# Patient Record
Sex: Female | Born: 1985 | Race: Black or African American | Hispanic: No | Marital: Married | Smoking: Never smoker
Health system: Southern US, Community
[De-identification: ages and names within clinical notes are randomized; demographics above are authoritative.]

## PROBLEM LIST (undated history)

## (undated) DIAGNOSIS — G43909 Migraine, unspecified, not intractable, without status migrainosus: Secondary | ICD-10-CM

## (undated) DIAGNOSIS — D649 Anemia, unspecified: Secondary | ICD-10-CM

## (undated) DIAGNOSIS — N390 Urinary tract infection, site not specified: Secondary | ICD-10-CM

## (undated) DIAGNOSIS — I10 Essential (primary) hypertension: Secondary | ICD-10-CM

## (undated) HISTORY — DX: Migraine, unspecified, not intractable, without status migrainosus: G43.909

## (undated) HISTORY — PX: SHOULDER SURGERY: SHX246

## (undated) HISTORY — PX: CHOLECYSTECTOMY: SHX55

---
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
## 2013-09-14 DIAGNOSIS — Z3009 Encounter for other general counseling and advice on contraception: Secondary | ICD-10-CM | POA: Insufficient documentation

## 2014-03-20 ENCOUNTER — Ambulatory Visit: Payer: Self-pay | Admitting: Internal Medicine

## 2014-08-24 ENCOUNTER — Ambulatory Visit
Admit: 2014-08-24 | Disposition: A | Discharge status: Home / Self Care | Payer: Self-pay | Attending: Family Medicine | Admitting: Family Medicine

## 2014-12-02 ENCOUNTER — Emergency Department
Admission: EM | Admit: 2014-12-02 | Discharge: 2014-12-02 | Disposition: A | Discharge status: Home / Self Care | Payer: 59 | Attending: Emergency Medicine | Admitting: Emergency Medicine

## 2014-12-02 ENCOUNTER — Encounter: Payer: Self-pay | Admitting: Emergency Medicine

## 2014-12-02 ENCOUNTER — Other Ambulatory Visit: Payer: Self-pay

## 2014-12-02 DIAGNOSIS — R5383 Other fatigue: Secondary | ICD-10-CM | POA: Insufficient documentation

## 2014-12-02 DIAGNOSIS — R531 Weakness: Secondary | ICD-10-CM | POA: Diagnosis not present

## 2014-12-02 DIAGNOSIS — F419 Anxiety disorder, unspecified: Secondary | ICD-10-CM | POA: Insufficient documentation

## 2014-12-02 LAB — BASIC METABOLIC PANEL
Anion gap: 6 (ref 5–15)
BUN: 7 mg/dL (ref 6–20)
CALCIUM: 8.6 mg/dL — AB (ref 8.9–10.3)
CHLORIDE: 107 mmol/L (ref 101–111)
CO2: 24 mmol/L (ref 22–32)
CREATININE: 0.63 mg/dL (ref 0.44–1.00)
Glucose, Bld: 120 mg/dL — ABNORMAL HIGH (ref 65–99)
POTASSIUM: 3.9 mmol/L (ref 3.5–5.1)
Sodium: 137 mmol/L (ref 135–145)

## 2014-12-02 LAB — CBC
HEMATOCRIT: 36.9 % (ref 35.0–47.0)
Hemoglobin: 12.4 g/dL (ref 12.0–16.0)
MCH: 28.7 pg (ref 26.0–34.0)
MCHC: 33.6 g/dL (ref 32.0–36.0)
MCV: 85.6 fL (ref 80.0–100.0)
Platelets: 217 10*3/uL (ref 150–440)
RBC: 4.32 MIL/uL (ref 3.80–5.20)
RDW: 13.6 % (ref 11.5–14.5)
WBC: 5 10*3/uL (ref 3.6–11.0)

## 2014-12-02 NOTE — ED Notes (Addendum)
Patient reports weakness for last couple of days. Patient reports having blood work done at that time. States she works in [employer].

## 2014-12-02 NOTE — ED Provider Notes (Signed)
[facility] Emergency Department Provider Note  ____________________________________________  Time seen: 1:15 PM  I have reviewed the triage vital signs and the nursing notes.   HISTORY  Chief Complaint Weakness    HPI Ellen Richardson is a 29 y.o. female who presents with complaints of low energy and fatigue over the last few days. She reports a history of anemia and notes that she had her blood taken at the health department 3 days ago but has not heard back from them so she decided to come to the emergency department. She did have a urine pregnancy done at the health Department which was negative. She denies fevers chills. No cough. No shortness of breath. No lightheadedness or dizziness. No chest pain. No recent travel, no leg swelling or pain     History reviewed. No pertinent past medical history.  There are no active problems to display for this patient.   History reviewed. No pertinent past surgical history.  No current outpatient prescriptions on file.  Allergies Review of patient's allergies indicates no known allergies.  No family history on file.  Social History History  Substance Use Topics  . Smoking status: Never Smoker   . Smokeless tobacco: Not on file  . Alcohol Use: Yes     Comment: rare    Review of Systems  Constitutional: Negative for fever. Eyes: Negative for visual changes. ENT: Negative for sore throat Cardiovascular: Negative for chest pain. Respiratory: Negative for shortness of breath. Gastrointestinal: Negative for abdominal pain, vomiting and diarrhea. Genitourinary: Negative for dysuria. Musculoskeletal: Negative for back pain. Skin: Negative for rash. Neurological: Negative for headaches or focal weakness Psychiatric: Mild anxiety    ____________________________________________   PHYSICAL EXAM:  VITAL SIGNS: ED Triage Vitals  Enc Vitals Group     BP 12/02/14 1234 124/80 mmHg     Pulse Rate  12/02/14 1234 83     Resp 12/02/14 1234 18     Temp 12/02/14 1234 98.4 F (36.9 C)     Temp Source 12/02/14 1234 Oral     SpO2 12/02/14 1234 99 %     Weight 12/02/14 1234 227 lb (102.967 kg)     Height 12/02/14 1234 5' (1.524 m)     Head Cir --      Peak Flow --      Pain Score --      Pain Loc --      Pain Edu? --      Excl. in GC? --      Constitutional: Alert and oriented. Well appearing and in no distress. Eyes: Conjunctivae are normal.  ENT   Head: Normocephalic and atraumatic.   Mouth/Throat: Mucous membranes are moist. Cardiovascular: Normal rate, regular rhythm. Normal and symmetric distal pulses are present in all extremities. No murmurs, rubs, or gallops. Respiratory: Normal respiratory effort without tachypnea nor retractions. Breath sounds are clear and equal bilaterally.  Gastrointestinal: Soft and non-tender in all quadrants. No distention. There is no CVA tenderness. Genitourinary: deferred Musculoskeletal: Nontender with normal range of motion in all extremities. No lower extremity tenderness nor edema. Neurologic:  Normal speech and language. No gross focal neurologic deficits are appreciated. Skin:  Skin is warm, dry and intact. No rash noted. Psychiatric: Mood and affect are normal. Patient exhibits appropriate insight and judgment.  ____________________________________________    LABS (pertinent positives/negatives)  Labs Reviewed  BASIC METABOLIC PANEL - Abnormal; Notable for the following:    Glucose, Bld 120 (*)    Calcium 8.6 (*)  All other components within normal limits  CBC    ____________________________________________   EKG  None  ____________________________________________    RADIOLOGY I have personally reviewed any xrays that were ordered on this patient: None  ____________________________________________   PROCEDURES  Procedure(s) performed: none  Critical Care performed:  none  ____________________________________________   INITIAL IMPRESSION / ASSESSMENT AND PLAN / ED COURSE  Pertinent labs & imaging results that were available during my care of the patient were reviewed by me and considered in my medical decision making (see chart for details).  Patient well-appearing and in no distress. Her vitals are completely normal and her labs are fully within normal limits. No evidence of anemia. No tachycardia, no short of breath. She denied burns to test 3 days ago. Recommend follow-up with the health department  ____________________________________________   FINAL CLINICAL IMPRESSION(S) / ED DIAGNOSES  Final diagnoses:  Other fatigue  Weakness generalized     Jene Every, MD 12/02/14 1335

## 2014-12-02 NOTE — Discharge Instructions (Signed)

## 2016-04-30 HISTORY — PX: IUD REMOVAL: SHX5392

## 2017-04-01 DIAGNOSIS — M7540 Impingement syndrome of unspecified shoulder: Secondary | ICD-10-CM | POA: Insufficient documentation

## 2017-04-01 DIAGNOSIS — M7551 Bursitis of right shoulder: Secondary | ICD-10-CM | POA: Insufficient documentation

## 2017-04-10 ENCOUNTER — Other Ambulatory Visit: Payer: Self-pay | Admitting: Orthopedic Surgery

## 2017-04-19 ENCOUNTER — Other Ambulatory Visit: Payer: Self-pay

## 2017-04-19 ENCOUNTER — Encounter
Admission: RE | Admit: 2017-04-19 | Discharge: 2017-04-19 | Disposition: A | Discharge status: Home / Self Care | Payer: 59 | Source: Ambulatory Visit | Attending: Orthopedic Surgery | Admitting: Orthopedic Surgery

## 2017-04-19 ENCOUNTER — Encounter: Payer: Self-pay | Admitting: *Deleted

## 2017-04-19 HISTORY — DX: Anemia, unspecified: D64.9

## 2017-04-19 NOTE — Patient Instructions (Signed)
  Your procedure is scheduled on: 04-26-17 FRIDAY Report to Same Day Surgery 2nd floor medical mall (Medical Mall Entrance-take elevator on left to 2nd floor.  Check in with surgery information desk.) To find out your arrival time please call [phone] between 1PM - 3PM on 04-25-17 THURSDAY  Remember: Instructions that are not followed completely may result in serious medical risk, up to and including death, or upon the discretion of your surgeon and anesthesiologist your surgery may need to be rescheduled.    _x___ 1. Do not eat food after midnight the night before your procedure. NO GUM OR CANDY AFTER MIDNIGHT.  You may drink clear liquids up to 2 hours before you are scheduled to arrive at the hospital for your procedure.  Do not drink clear liquids within 2 hours of your scheduled arrival to the hospital.  Clear liquids include  --Water or Apple juice without pulp  --Clear carbohydrate beverage such as ClearFast or Gatorade  --Black Coffee or Clear Tea (No milk, no creamers, do not add anything to the coffee or Tea      __x__ 2. No Alcohol for 24 hours before or after surgery.   __x__3. No Smoking for 24 prior to surgery.   ____  4. Bring all medications with you on the day of surgery if instructed.    __x__ 5. Notify your doctor if there is any change in your medical condition     (cold, fever, infections).     Do not wear jewelry, make-up, hairpins, clips or nail polish.  Do not wear lotions, powders, or perfumes. You may wear deodorant.  Do not shave 48 hours prior to surgery. Men may shave face and neck.  Do not bring valuables to the hospital.    [facility] is not responsible for any belongings or valuables.               Contacts, dentures or bridgework may not be worn into surgery.  Leave your suitcase in the car. After surgery it may be brought to your room.  For patients admitted to the hospital, discharge time is determined by your treatment team.   Patients  discharged the day of surgery will not be allowed to drive home.  You will need someone to drive you home and stay with you the night of your procedure.    Please read over the following fact sheets that you were given:   [place] [facility] and or MRSA Information   ____ Take anti-hypertensive listed below, cardiac, seizure, asthma, anti-reflux and psychiatric medicines. These include:  1. NONE  2.  3.  4.  5.  6.  ____Fleets enema or Magnesium Citrate as directed.   _x___ Use CHG Soap or sage wipes as directed on instruction sheet   ____ Use inhalers on the day of surgery and bring to hospital day of surgery  ____ Stop Metformin and Janumet 2 days prior to surgery.    ____ Take 1/2 of usual insulin dose the night before surgery and none on the morning surgery.   ____ Follow recommendations from Cardiologist, Pulmonologist or PCP regarding stopping Aspirin, Coumadin, Plavix ,Eliquis, Effient, or Pradaxa, and Pletal.  X____Stop Anti-inflammatories such as Advil, Aleve, Ibuprofen, Motrin, Naproxen, Naprosyn, Goodies powders or aspirin products NOW-OK to take Tylenol    ____ Stop supplements until after surgery.    ____ Bring C-Pap to the hospital.    

## 2017-04-25 ENCOUNTER — Encounter
Admission: RE | Admit: 2017-04-25 | Discharge: 2017-04-25 | Disposition: A | Discharge status: Home / Self Care | Payer: 59 | Source: Ambulatory Visit | Attending: Orthopedic Surgery | Admitting: Orthopedic Surgery

## 2017-04-25 DIAGNOSIS — M7541 Impingement syndrome of right shoulder: Secondary | ICD-10-CM | POA: Diagnosis present

## 2017-04-25 DIAGNOSIS — M7551 Bursitis of right shoulder: Secondary | ICD-10-CM | POA: Diagnosis not present

## 2017-04-25 DIAGNOSIS — Z6841 Body Mass Index (BMI) 40.0 and over, adult: Secondary | ICD-10-CM | POA: Diagnosis not present

## 2017-04-25 LAB — CBC WITH DIFFERENTIAL/PLATELET
BASOS PCT: 1 %
Basophils Absolute: 0 10*3/uL (ref 0–0.1)
EOS ABS: 0 10*3/uL (ref 0–0.7)
Eosinophils Relative: 1 %
HCT: 34.2 % — ABNORMAL LOW (ref 35.0–47.0)
HEMOGLOBIN: 11.6 g/dL — AB (ref 12.0–16.0)
LYMPHS ABS: 1.8 10*3/uL (ref 1.0–3.6)
Lymphocytes Relative: 40 %
MCH: 27.9 pg (ref 26.0–34.0)
MCHC: 33.9 g/dL (ref 32.0–36.0)
MCV: 82.4 fL (ref 80.0–100.0)
Monocytes Absolute: 0.2 10*3/uL (ref 0.2–0.9)
Monocytes Relative: 5 %
NEUTROS PCT: 53 %
Neutro Abs: 2.5 10*3/uL (ref 1.4–6.5)
Platelets: 226 10*3/uL (ref 150–440)
RBC: 4.15 MIL/uL (ref 3.80–5.20)
RDW: 14.9 % — ABNORMAL HIGH (ref 11.5–14.5)
WBC: 4.6 10*3/uL (ref 3.6–11.0)

## 2017-04-25 LAB — BASIC METABOLIC PANEL
Anion gap: 5 (ref 5–15)
BUN: 10 mg/dL (ref 6–20)
CHLORIDE: 107 mmol/L (ref 101–111)
CO2: 24 mmol/L (ref 22–32)
CREATININE: 0.66 mg/dL (ref 0.44–1.00)
Calcium: 8.4 mg/dL — ABNORMAL LOW (ref 8.9–10.3)
GFR calc non Af Amer: >60 mL/min (ref >60)
Glucose, Bld: 105 mg/dL — ABNORMAL HIGH (ref 65–99)
Potassium: 3.6 mmol/L (ref 3.5–5.1)
SODIUM: 136 mmol/L (ref 135–145)

## 2017-04-25 LAB — PROTIME-INR
INR: 1.07
PROTHROMBIN TIME: 13.8 s (ref 11.4–15.2)

## 2017-04-25 LAB — APTT: aPTT: 34 seconds (ref 24–36)

## 2017-04-25 MED ORDER — CEFAZOLIN SODIUM-DEXTROSE 2-4 GM/100ML-% IV SOLN
2.0000 g | INTRAVENOUS | Status: AC
  Administered 2017-04-26: 2 g via INTRAVENOUS

## 2017-04-25 NOTE — Pre-Procedure Instructions (Signed)
MEDICAL CLEARANCE ON CHART FROM PCP-MODERATE RISK 

## 2017-04-26 ENCOUNTER — Encounter
Admission: RE | Disposition: A | Discharge status: Home / Self Care | Payer: Self-pay | Source: Ambulatory Visit | Attending: Orthopedic Surgery

## 2017-04-26 ENCOUNTER — Ambulatory Visit
Admission: RE | Admit: 2017-04-26 | Discharge: 2017-04-26 | Disposition: A | Discharge status: Home / Self Care | Payer: 59 | Source: Ambulatory Visit | Attending: Orthopedic Surgery | Admitting: Orthopedic Surgery

## 2017-04-26 ENCOUNTER — Ambulatory Visit: Payer: 59 | Admitting: Certified Registered Nurse Anesthetist

## 2017-04-26 DIAGNOSIS — M7541 Impingement syndrome of right shoulder: Secondary | ICD-10-CM | POA: Diagnosis not present

## 2017-04-26 DIAGNOSIS — M7551 Bursitis of right shoulder: Secondary | ICD-10-CM | POA: Insufficient documentation

## 2017-04-26 DIAGNOSIS — Z6841 Body Mass Index (BMI) 40.0 and over, adult: Secondary | ICD-10-CM | POA: Insufficient documentation

## 2017-04-26 HISTORY — PX: SHOULDER ARTHROSCOPY WITH SUBACROMIAL DECOMPRESSION: SHX5684

## 2017-04-26 LAB — POCT PREGNANCY, URINE: PREG TEST UR: NEGATIVE

## 2017-04-26 SURGERY — SHOULDER ARTHROSCOPY WITH SUBACROMIAL DECOMPRESSION
Anesthesia: General | Site: Shoulder | Laterality: Right | Wound class: Clean

## 2017-04-26 MED ORDER — FENTANYL CITRATE (PF) 100 MCG/2ML IJ SOLN
INTRAMUSCULAR | Status: DC | PRN
  Administered 2017-04-26: 50 ug via INTRAVENOUS
  Administered 2017-04-26: 25 ug via INTRAVENOUS

## 2017-04-26 MED ORDER — DEXAMETHASONE SODIUM PHOSPHATE 10 MG/ML IJ SOLN
INTRAMUSCULAR | Status: DC | PRN
  Administered 2017-04-26: 10 mg via INTRAVENOUS

## 2017-04-26 MED ORDER — PHENYLEPHRINE HCL 10 MG/ML IJ SOLN
INTRAMUSCULAR | Status: DC | PRN
  Administered 2017-04-26: 100 ug via INTRAVENOUS
  Administered 2017-04-26: 200 ug via INTRAVENOUS

## 2017-04-26 MED ORDER — ACETAMINOPHEN 10 MG/ML IV SOLN
INTRAVENOUS | Status: AC
  Filled 2017-04-26: qty 100

## 2017-04-26 MED ORDER — ONDANSETRON HCL 4 MG/2ML IJ SOLN
INTRAMUSCULAR | Status: DC | PRN
  Administered 2017-04-26: 4 mg via INTRAVENOUS

## 2017-04-26 MED ORDER — FENTANYL CITRATE (PF) 100 MCG/2ML IJ SOLN: 25.0000 ug | INTRAMUSCULAR | Status: DC | PRN

## 2017-04-26 MED ORDER — LIDOCAINE HCL (CARDIAC) 20 MG/ML IV SOLN
INTRAVENOUS | Status: DC | PRN
  Administered 2017-04-26: 100 mg via INTRAVENOUS

## 2017-04-26 MED ORDER — MIDAZOLAM HCL 2 MG/2ML IJ SOLN
1.0000 mg | Freq: Once | INTRAMUSCULAR | Status: AC
  Administered 2017-04-26: 1 mg via INTRAVENOUS

## 2017-04-26 MED ORDER — BUPIVACAINE LIPOSOME 1.3 % IJ SUSP
INTRAMUSCULAR | Status: DC | PRN
  Administered 2017-04-26: 20 mL via PERINEURAL

## 2017-04-26 MED ORDER — BUPIVACAINE LIPOSOME 1.3 % IJ SUSP
INTRAMUSCULAR | Status: AC
Start: 2017-04-26 — End: 2017-04-26
  Filled 2017-04-26: qty 20

## 2017-04-26 MED ORDER — BUPIVACAINE HCL (PF) 0.25 % IJ SOLN
INTRAMUSCULAR | Status: DC | PRN
  Administered 2017-04-26: 30 mL

## 2017-04-26 MED ORDER — FAMOTIDINE 20 MG PO TABS
ORAL_TABLET | ORAL | Status: AC
  Administered 2017-04-26: 20 mg via ORAL
  Filled 2017-04-26: qty 1

## 2017-04-26 MED ORDER — SUCCINYLCHOLINE CHLORIDE 20 MG/ML IJ SOLN
INTRAMUSCULAR | Status: DC | PRN
  Administered 2017-04-26: 120 mg via INTRAVENOUS

## 2017-04-26 MED ORDER — SUGAMMADEX SODIUM 200 MG/2ML IV SOLN
INTRAVENOUS | Status: DC | PRN
  Administered 2017-04-26: 200 mg via INTRAVENOUS

## 2017-04-26 MED ORDER — LACTATED RINGERS IV SOLN
INTRAVENOUS | Status: DC
  Administered 2017-04-26: 1000 mL via INTRAVENOUS

## 2017-04-26 MED ORDER — CEFAZOLIN SODIUM-DEXTROSE 2-4 GM/100ML-% IV SOLN
INTRAVENOUS | Status: AC
  Filled 2017-04-26: qty 100

## 2017-04-26 MED ORDER — EPINEPHRINE PF 1 MG/ML IJ SOLN
INTRAMUSCULAR | Status: DC | PRN
  Administered 2017-04-26: 6 mL

## 2017-04-26 MED ORDER — ACETAMINOPHEN 10 MG/ML IV SOLN
INTRAVENOUS | Status: DC | PRN
  Administered 2017-04-26: 1000 mg via INTRAVENOUS

## 2017-04-26 MED ORDER — FENTANYL CITRATE (PF) 100 MCG/2ML IJ SOLN
INTRAMUSCULAR | Status: AC
  Filled 2017-04-26: qty 2

## 2017-04-26 MED ORDER — LIDOCAINE HCL (PF) 2 % IJ SOLN
INTRAMUSCULAR | Status: AC
  Filled 2017-04-26: qty 10

## 2017-04-26 MED ORDER — LIDOCAINE HCL (PF) 1 % IJ SOLN
INTRAMUSCULAR | Status: AC
  Filled 2017-04-26: qty 5

## 2017-04-26 MED ORDER — FENTANYL CITRATE (PF) 100 MCG/2ML IJ SOLN
INTRAMUSCULAR | Status: AC
  Administered 2017-04-26: 50 ug via INTRAVENOUS
  Filled 2017-04-26: qty 2

## 2017-04-26 MED ORDER — LIDOCAINE HCL (PF) 1 % IJ SOLN
INTRAMUSCULAR | Status: DC | PRN
  Administered 2017-04-26: 4 mL via SUBCUTANEOUS

## 2017-04-26 MED ORDER — ONDANSETRON HCL 4 MG/2ML IJ SOLN
INTRAMUSCULAR | Status: AC
  Filled 2017-04-26: qty 2

## 2017-04-26 MED ORDER — ONDANSETRON HCL 4 MG/2ML IJ SOLN
4.0000 mg | Freq: Once | INTRAMUSCULAR | Status: AC
  Administered 2017-04-26: 4 mg via INTRAVENOUS

## 2017-04-26 MED ORDER — FAMOTIDINE 20 MG PO TABS
20.0000 mg | ORAL_TABLET | Freq: Once | ORAL | Status: AC
  Administered 2017-04-26: 20 mg via ORAL

## 2017-04-26 MED ORDER — SUCCINYLCHOLINE CHLORIDE 20 MG/ML IJ SOLN
INTRAMUSCULAR | Status: AC
  Filled 2017-04-26: qty 1

## 2017-04-26 MED ORDER — CHLORHEXIDINE GLUCONATE CLOTH 2 % EX PADS: 6.0000 | MEDICATED_PAD | Freq: Once | CUTANEOUS | Status: DC

## 2017-04-26 MED ORDER — PROPOFOL 10 MG/ML IV BOLUS
INTRAVENOUS | Status: AC
  Filled 2017-04-26: qty 20

## 2017-04-26 MED ORDER — ROCURONIUM BROMIDE 50 MG/5ML IV SOLN
INTRAVENOUS | Status: AC
  Filled 2017-04-26: qty 1

## 2017-04-26 MED ORDER — PROPOFOL 10 MG/ML IV BOLUS
INTRAVENOUS | Status: DC | PRN
  Administered 2017-04-26: 150 mg via INTRAVENOUS

## 2017-04-26 MED ORDER — BUPIVACAINE HCL (PF) 0.5 % IJ SOLN
INTRAMUSCULAR | Status: DC | PRN
  Administered 2017-04-26: 10 mL via PERINEURAL

## 2017-04-26 MED ORDER — PROMETHAZINE HCL 25 MG/ML IJ SOLN: 6.2500 mg | INTRAMUSCULAR | Status: DC | PRN

## 2017-04-26 MED ORDER — CHLORHEXIDINE GLUCONATE CLOTH 2 % EX PADS
6.0000 | MEDICATED_PAD | Freq: Once | CUTANEOUS | Status: AC
  Administered 2017-04-26: 6 via TOPICAL

## 2017-04-26 MED ORDER — MIDAZOLAM HCL 2 MG/2ML IJ SOLN
INTRAMUSCULAR | Status: AC
  Administered 2017-04-26: 1 mg via INTRAVENOUS
  Filled 2017-04-26: qty 2

## 2017-04-26 MED ORDER — BUPIVACAINE HCL (PF) 0.25 % IJ SOLN
INTRAMUSCULAR | Status: AC
  Filled 2017-04-26: qty 30

## 2017-04-26 MED ORDER — NEOMYCIN-POLYMYXIN B GU 40-200000 IR SOLN
Status: DC | PRN
  Administered 2017-04-26: 2 mL

## 2017-04-26 MED ORDER — ONDANSETRON HCL 4 MG PO TABS: 4.0000 mg | ORAL_TABLET | Freq: Three times a day (TID) | ORAL | 0 refills | Status: DC | PRN

## 2017-04-26 MED ORDER — ROCURONIUM BROMIDE 100 MG/10ML IV SOLN
INTRAVENOUS | Status: DC | PRN
  Administered 2017-04-26: 40 mg via INTRAVENOUS
  Administered 2017-04-26: 10 mg via INTRAVENOUS

## 2017-04-26 MED ORDER — FENTANYL CITRATE (PF) 100 MCG/2ML IJ SOLN
50.0000 ug | Freq: Once | INTRAMUSCULAR | Status: AC
  Administered 2017-04-26: 50 ug via INTRAVENOUS

## 2017-04-26 MED ORDER — ONDANSETRON HCL 4 MG/2ML IJ SOLN
INTRAMUSCULAR | Status: AC
  Administered 2017-04-26: 4 mg via INTRAVENOUS
  Filled 2017-04-26: qty 2

## 2017-04-26 MED ORDER — DEXAMETHASONE SODIUM PHOSPHATE 10 MG/ML IJ SOLN
INTRAMUSCULAR | Status: AC
  Filled 2017-04-26: qty 1

## 2017-04-26 MED ORDER — OXYCODONE HCL 5 MG PO TABS: 5.0000 mg | ORAL_TABLET | ORAL | 0 refills | Status: DC | PRN

## 2017-04-26 MED ORDER — BUPIVACAINE HCL (PF) 0.5 % IJ SOLN
INTRAMUSCULAR | Status: AC
  Filled 2017-04-26: qty 10

## 2017-04-26 SURGICAL SUPPLY — 68 items
ADAPTER IRRIG TUBE 2 SPIKE SOL (ADAPTER) ×4 IMPLANT
BUR RADIUS 4.0X18.5 (BURR) ×2 IMPLANT
BUR RADIUS 5.5 (BURR) ×2 IMPLANT
CANISTER SUCT LVC 12 LTR MEDI- (MISCELLANEOUS) IMPLANT
CANNULA 5.75X7 CRYSTAL CLEAR (CANNULA) ×4 IMPLANT
CANNULA PARTIAL THREAD 2X7 (CANNULA) ×2 IMPLANT
CANNULA TWIST IN 8.25X9CM (CANNULA) IMPLANT
CONNECTOR PERFECT PASSER (CONNECTOR) IMPLANT
COOLER POLAR GLACIER W/PUMP (MISCELLANEOUS) ×2 IMPLANT
CRADLE LAMINECT ARM (MISCELLANEOUS) ×2 IMPLANT
DEVICE SUCT BLK HOLE OR FLOOR (MISCELLANEOUS) ×2 IMPLANT
DRAPE IMP U-DRAPE 54X76 (DRAPES) ×4 IMPLANT
DRAPE INCISE IOBAN 66X45 STRL (DRAPES) ×2 IMPLANT
DRAPE SHEET LG 3/4 BI-LAMINATE (DRAPES) ×2 IMPLANT
DRAPE U-SHAPE 47X51 STRL (DRAPES) IMPLANT
DURAPREP 26ML APPLICATOR (WOUND CARE) ×8 IMPLANT
ELECT REM PT RETURN 9FT ADLT (ELECTROSURGICAL) ×2
ELECTRODE REM PT RTRN 9FT ADLT (ELECTROSURGICAL) ×1 IMPLANT
GAUZE PETRO XEROFOAM 1X8 (MISCELLANEOUS) ×2 IMPLANT
GAUZE SPONGE 4X4 12PLY STRL (GAUZE/BANDAGES/DRESSINGS) ×4 IMPLANT
GLOVE BIOGEL PI IND STRL 9 (GLOVE) ×1 IMPLANT
GLOVE BIOGEL PI INDICATOR 9 (GLOVE) ×1
GLOVE SURG 9.0 ORTHO LTXF (GLOVE) ×4 IMPLANT
GOWN STRL REUS TWL 2XL XL LVL4 (GOWN DISPOSABLE) ×2 IMPLANT
GOWN STRL REUS W/ TWL LRG LVL3 (GOWN DISPOSABLE) ×1 IMPLANT
GOWN STRL REUS W/ TWL LRG LVL4 (GOWN DISPOSABLE) ×1 IMPLANT
GOWN STRL REUS W/TWL LRG LVL3 (GOWN DISPOSABLE) ×1
GOWN STRL REUS W/TWL LRG LVL4 (GOWN DISPOSABLE) ×1
IV LACTATED RINGER IRRG 3000ML (IV SOLUTION) ×6
IV LR IRRIG 3000ML ARTHROMATIC (IV SOLUTION) ×6 IMPLANT
KIT RM TURNOVER STRD PROC AR (KITS) ×2 IMPLANT
KIT STABILIZATION SHOULDER (MISCELLANEOUS) ×2 IMPLANT
KIT SUTURE 2.8 Q-FIX DISP (MISCELLANEOUS) IMPLANT
KIT SUTURETAK 3.0 INSERT PERC (KITS) IMPLANT
MANIFOLD NEPTUNE II (INSTRUMENTS) ×2 IMPLANT
MASK FACE SPIDER DISP (MASK) ×2 IMPLANT
MAT BLUE FLOOR 46X72 FLO (MISCELLANEOUS) ×4 IMPLANT
NDL SAFETY ECLIPSE 18X1.5 (NEEDLE) ×1 IMPLANT
NEEDLE HYPO 18GX1.5 SHARP (NEEDLE) ×1
NEEDLE HYPO 22GX1.5 SAFETY (NEEDLE) ×2 IMPLANT
NS IRRIG 500ML POUR BTL (IV SOLUTION) ×2 IMPLANT
PACK ARTHROSCOPY SHOULDER (MISCELLANEOUS) ×2 IMPLANT
PAD WRAPON POLAR SHDR XLG (MISCELLANEOUS) ×1 IMPLANT
PASSER SUT CAPTURE FIRST (SUTURE) IMPLANT
SET TUBE SUCT SHAVER OUTFL 24K (TUBING) ×2 IMPLANT
SET TUBE TIP INTRA-ARTICULAR (MISCELLANEOUS) ×2 IMPLANT
SLING ARM M TX990204 (SOFTGOODS) ×2 IMPLANT
STRAP SAFETY BODY (MISCELLANEOUS) ×2 IMPLANT
STRIP CLOSURE SKIN 1/2X4 (GAUZE/BANDAGES/DRESSINGS) ×4 IMPLANT
SUT ETHILON 4-0 (SUTURE) ×1
SUT ETHILON 4-0 FS2 18XMFL BLK (SUTURE) ×1
SUT LASSO 90 DEG SD STR (SUTURE) IMPLANT
SUT MNCRL 4-0 (SUTURE)
SUT MNCRL 4-0 27XMFL (SUTURE)
SUT PDS AB 0 CT1 27 (SUTURE) IMPLANT
SUT PERFECTPASSER WHITE CART (SUTURE) IMPLANT
SUT VIC AB 0 CT1 36 (SUTURE) IMPLANT
SUT VIC AB 2-0 CT2 27 (SUTURE) IMPLANT
SUTURE ETHLN 4-0 FS2 18XMF BLK (SUTURE) ×1 IMPLANT
SUTURE MAGNUM WIRE 2X48 BLK (SUTURE) IMPLANT
SUTURE MNCRL 4-0 27XMF (SUTURE) IMPLANT
SYR 10ML LL (SYRINGE) ×2 IMPLANT
SYR 3ML 18GX1 1/2 (SYRINGE) ×2 IMPLANT
TAPE MICROFOAM 4IN (TAPE) ×2 IMPLANT
TUBING ARTHRO INFLOW-ONLY STRL (TUBING) ×2 IMPLANT
TUBING CONNECTING 10 (TUBING) ×2 IMPLANT
WAND HAND CNTRL MULTIVAC 90 (MISCELLANEOUS) ×2 IMPLANT
WRAPON POLAR PAD SHDR XLG (MISCELLANEOUS) ×2

## 2017-04-26 NOTE — H&P (Signed)
The patient has been re-examined, and the chart reviewed, and there have been no interval changes to the documented history and physical.    The risks, benefits, and alternatives have been discussed at length, and the patient is willing to proceed.   

## 2017-04-26 NOTE — Progress Notes (Signed)
Husband states that he understands instructions

## 2017-04-26 NOTE — Anesthesia Post-op Follow-up Note (Signed)
Anesthesia QCDR form completed.        

## 2017-04-26 NOTE — Anesthesia Procedure Notes (Signed)
Anesthesia Regional Block: Interscalene brachial plexus block   Pre-Anesthetic Checklist: ,, timeout performed, Correct Patient, Correct Site, Correct Laterality, Correct Procedure, Correct Position, site marked, Risks and benefits discussed,  Surgical consent,  Pre-op evaluation,  At surgeon's request and post-op pain management  Laterality: Right and Upper  Prep: alcohol swabs       Needles:  Injection technique: Single-shot  Needle Type: Stimiplex     Needle Length: 5cm  Needle Gauge: 22     Additional Needles:   Procedures:,,,, ultrasound used (permanent image in chart),,,,  Narrative:  Start time: 04/26/2017 10:58 AM End time: 04/26/2017 11:03 AM Injection made incrementally with aspirations every 5 mL.  Performed by: Personally  Anesthesiologist: Symon Norwood, MD  Additional Notes: Functioning IV was confirmed and monitors were applied.  A 50mm 22ga Stimuplex needle was used. Sterile prep and drape,hand hygiene and sterile gloves were used.  Negative aspiration and negative test dose prior to incremental administration of local anesthetic. The patient tolerated the procedure well.        

## 2017-04-26 NOTE — Discharge Instructions (Signed)

## 2017-04-26 NOTE — Transfer of Care (Signed)
Immediate Anesthesia Transfer of Care Note  Patient: Ellen Richardson  Procedure(s) Performed: SHOULDER ARTHROSCOPY WITH SUBACROMIAL DECOMPRESSION (Right Shoulder)  Patient Location: PACU  Anesthesia Type:General  Level of Consciousness: sedated  Airway & Oxygen Therapy: Patient Spontanous Breathing and Patient connected to face mask oxygen  Post-op Assessment: Report given to RN and Post -op Vital signs reviewed and stable  Post vital signs: Reviewed and stable  Last Vitals:  Vitals:   04/26/17 1105 04/26/17 1305  BP: 121/84 (!) 116/91  Pulse: 78 95  Resp: 17 (!) 30  Temp:  (!) 36.1 C  SpO2: 100% 100%    Last Pain:  Vitals:   04/26/17 1022  TempSrc: Tympanic         Complications: No apparent anesthesia complications

## 2017-04-26 NOTE — Op Note (Signed)
04/26/2017  1:18 PM  PATIENT:  Ellen Richardson    PRE-OPERATIVE DIAGNOSIS:  Right shoulder impingement with bursitis  POST-OPERATIVE DIAGNOSIS:  Same  PROCEDURE:  RIGHT SHOULDER ARTHROSCOPY WITH SUBACROMIAL DECOMPRESSION AND DEBRIDEMENT OF THE BURSA  SURGEON:  Thornton Park, MD  ANESTHESIA:   General  PREOPERATIVE INDICATIONS:  Ellen Richardson is a  31 y.o. female with a diagnosis of persistent right shoulder pain exacerbated by overhead and abduction activity.  She had signs and symptoms of impingement.    I discussed the risks and benefits of surgery. The risks include but are not limited to infection, bleeding requiring blood transfusion, nerve or blood vessel injury, joint stiffness or loss of motion, persistent pain, weakness or instability and the need for further surgery. Medical risks include but are not limited to DVT and pulmonary embolism, myocardial infarction, stroke, pneumonia, respiratory failure and death. Patient understood these risks and wished to proceed.   OPERATIVE FINDINGS: The right rotator cuff, labrum  and biceps tendon were intact. Patient had a severe subacromial bursitis and subacromial spurring.  OPERATIVE PROCEDURE: The patient was met in the preoperative area. The right shoulder was signed with the word yes and my initials according the hospital's correct site of surgery protocol.  History and physical was updated. Patient underwent an interscalene block in the preop area with Exparel by the anesthesia service.  She was then brought to the OR where she underwent general anesthesia. The patient was placed in a beachchair position.  A spider arm positioner was used for this case. Examination under anesthesia revealed no limitation of motion or instability with load shift testing. The patient had a negative sulcus sign.  Patient was prepped and draped in a sterile fashion. A timeout was performed to verify the patient's name, date of birth, medical record number,  correct site of surgery and correct procedure to be performed there was also used to verify the patient received antibiotics that all appropriate instruments, implants and radiographs studies were available in the room. Once all in attendance were in agreement case began.  Bony landmarks were drawn out with a surgical marker along with proposed arthroscopy incisions. These were pre-injected with 1% lidocaine plain. An 11 blade was used to establish a posterior portal through which the arthroscope was placed in the glenohumeral joint. A full diagnostic examination of the shoulder was performed. There was no tear of the rotator cuff or glenoid labrum identified.   The arthroscope was then placed in the subacromial space.  Extensive bursitis was encountered. A lateral portal was established with an 18-gauge spinal needle for localization. A 90 ArthroCare wand and 40 resector shaver blade were used to form an extensive subdeltoid and subacromial bursectomy.   A subacromial decompression was performed using a 5.5 mm resector shaver blade from the lateral portal.  Subacromial space was then copiously irrigated to remove all osseous debris. Final arthroscopic images were taken. Arthroscopic instruments were then removed.  Skin closure for the arthroscopic incisions was performed with 4-0 nylon.   0.25% Marcaine plain was then injected into the subacromial space for postoperative pain control. A dry sterile dressing was applied.  The patient was placed in a sling and  a Polar Care sleeve.  All sharp and instrument counts were correct at the conclusion of the case. I was scrubbed and present for the entire case. I spoke with the patient's family postoperatively to let them know the case had been performed without complication and the patient was stable  in recovery room.

## 2017-04-26 NOTE — Anesthesia Procedure Notes (Signed)
Procedure Name: Intubation Date/Time: 04/26/2017 11:22 AM Performed by: Ginger CarneMichelet, Valentine Kuechle, CRNA Pre-anesthesia Checklist: Patient identified, Emergency Drugs available, Suction available, Patient being monitored and Timeout performed Patient Re-evaluated:Patient Re-evaluated prior to induction Oxygen Delivery Method: Circle system utilized Preoxygenation: Pre-oxygenation with 100% oxygen Induction Type: IV induction Ventilation: Mask ventilation without difficulty and Oral airway inserted - appropriate to patient size Laryngoscope Size: Hyacinth MeekerMiller and 2 Grade View: Grade II Tube type: Oral Tube size: 7.0 mm Number of attempts: 1 Airway Equipment and Method: Stylet Placement Confirmation: ETT inserted through vocal cords under direct vision,  positive ETCO2 and breath sounds checked- equal and bilateral Secured at: 22 cm Tube secured with: Tape Dental Injury: Teeth and Oropharynx as per pre-operative assessment  Difficulty Due To: Difficulty was anticipated

## 2017-04-26 NOTE — Anesthesia Preprocedure Evaluation (Signed)
Anesthesia Evaluation  Patient identified by MRN, date of birth, ID band Patient awake    Reviewed: Allergy & Precautions, H&P , NPO status , Patient's Chart, lab work & pertinent test results, reviewed documented beta blocker date and time   History of Anesthesia Complications Negative for: history of anesthetic complications  Airway Mallampati: I  TM Distance: >3 FB Neck ROM: full    Dental  (+) Dental Advidsory Given, Missing   Pulmonary neg pulmonary ROS,           Cardiovascular Exercise Tolerance: Good negative cardio ROS       Neuro/Psych negative neurological ROS  negative psych ROS   GI/Hepatic negative GI ROS, Neg liver ROS,   Endo/Other  neg diabetesMorbid obesity  Renal/GU negative Renal ROS  negative genitourinary   Musculoskeletal   Abdominal   Peds  Hematology  (+) Blood dyscrasia, anemia ,   Anesthesia Other Findings Past Medical History: 2008: Anemia     Comment:  h/o   Reproductive/Obstetrics negative OB ROS                             Anesthesia Physical Anesthesia Plan  ASA: III  Anesthesia Plan: General   Post-op Pain Management:    Induction: Intravenous  PONV Risk Score and Plan: 3 and Ondansetron and Dexamethasone  Airway Management Planned: Oral ETT  Additional Equipment:   Intra-op Plan:   Post-operative Plan: Extubation in OR  Informed Consent: I have reviewed the patients History and Physical, chart, labs and discussed the procedure including the risks, benefits and alternatives for the proposed anesthesia with the patient or authorized representative who has indicated his/her understanding and acceptance.   Dental Advisory Given  Plan Discussed with: Anesthesiologist, CRNA and Surgeon  Anesthesia Plan Comments:         Anesthesia Quick Evaluation

## 2017-04-26 NOTE — OR Nursing (Signed)
Dr. Martha ClanKrasinski in to see patient @ 1435

## 2017-04-28 ENCOUNTER — Encounter: Payer: Self-pay | Admitting: Orthopedic Surgery

## 2017-04-29 NOTE — Anesthesia Postprocedure Evaluation (Signed)
Anesthesia Post Note  Patient: Ellen Richardson  Procedure(s) Performed: SHOULDER ARTHROSCOPY WITH SUBACROMIAL DECOMPRESSION (Right Shoulder)  Patient location during evaluation: PACU Anesthesia Type: General and Regional Level of consciousness: awake and alert Pain management: pain level controlled Vital Signs Assessment: post-procedure vital signs reviewed and stable Respiratory status: spontaneous breathing, nonlabored ventilation, respiratory function stable and patient connected to nasal cannula oxygen Cardiovascular status: blood pressure returned to baseline and stable Postop Assessment: no apparent nausea or vomiting Anesthetic complications: no     Last Vitals:  Vitals:   04/26/17 1413 04/26/17 1530  BP: 116/75 129/87  Pulse: 79 73  Resp: 18 18  Temp: 36.6 C   SpO2: 96% 98%    Last Pain:  Vitals:   04/29/17 1201  TempSrc:   PainSc: 6                  Lenard SimmerAndrew Sundae Maners

## 2017-08-26 DIAGNOSIS — N921 Excessive and frequent menstruation with irregular cycle: Secondary | ICD-10-CM | POA: Insufficient documentation

## 2018-10-24 ENCOUNTER — Ambulatory Visit: Payer: Self-pay | Admitting: Podiatry

## 2018-11-04 ENCOUNTER — Ambulatory Visit: Payer: Self-pay | Admitting: Podiatry

## 2018-11-17 ENCOUNTER — Emergency Department: Payer: 59

## 2018-11-17 ENCOUNTER — Emergency Department
Admission: EM | Admit: 2018-11-17 | Discharge: 2018-11-17 | Disposition: A | Discharge status: Home / Self Care | Payer: 59 | Attending: Emergency Medicine | Admitting: Emergency Medicine

## 2018-11-17 ENCOUNTER — Other Ambulatory Visit: Payer: Self-pay

## 2018-11-17 DIAGNOSIS — R0789 Other chest pain: Secondary | ICD-10-CM

## 2018-11-17 DIAGNOSIS — Z79899 Other long term (current) drug therapy: Secondary | ICD-10-CM | POA: Insufficient documentation

## 2018-11-17 DIAGNOSIS — R079 Chest pain, unspecified: Secondary | ICD-10-CM | POA: Diagnosis present

## 2018-11-17 LAB — CBC
HCT: 33.5 % — ABNORMAL LOW (ref 36.0–46.0)
Hemoglobin: 11.3 g/dL — ABNORMAL LOW (ref 12.0–15.0)
MCH: 27.7 pg (ref 26.0–34.0)
MCHC: 33.7 g/dL (ref 30.0–36.0)
MCV: 82.1 fL (ref 80.0–100.0)
Platelets: 232 10*3/uL (ref 150–400)
RBC: 4.08 MIL/uL (ref 3.87–5.11)
RDW: 13.8 % (ref 11.5–15.5)
WBC: 4.7 10*3/uL (ref 4.0–10.5)
nRBC: 0 % (ref 0.0–0.2)

## 2018-11-17 LAB — TROPONIN I (HIGH SENSITIVITY)
Troponin I (High Sensitivity): <2 ng/L (ref <18)
Troponin I (High Sensitivity): <2 ng/L (ref <18)

## 2018-11-17 LAB — BASIC METABOLIC PANEL
Anion gap: 8 (ref 5–15)
BUN: 11 mg/dL (ref 6–20)
CO2: 20 mmol/L — ABNORMAL LOW (ref 22–32)
Calcium: 8.5 mg/dL — ABNORMAL LOW (ref 8.9–10.3)
Chloride: 109 mmol/L (ref 98–111)
Creatinine, Ser: 0.6 mg/dL (ref 0.44–1.00)
GFR calc Af Amer: >60 mL/min (ref >60)
GFR calc non Af Amer: >60 mL/min (ref >60)
Glucose, Bld: 109 mg/dL — ABNORMAL HIGH (ref 70–99)
Potassium: 3.8 mmol/L (ref 3.5–5.1)
Sodium: 137 mmol/L (ref 135–145)

## 2018-11-17 LAB — POCT PREGNANCY, URINE: Preg Test, Ur: NEGATIVE

## 2018-11-17 MED ORDER — FAMOTIDINE 20 MG PO TABS
20.0000 mg | ORAL_TABLET | Freq: Once | ORAL | Status: AC
  Administered 2018-11-17: 20 mg via ORAL
  Filled 2018-11-17: qty 1

## 2018-11-17 MED ORDER — METOCLOPRAMIDE HCL 10 MG PO TABS
10.0000 mg | ORAL_TABLET | Freq: Once | ORAL | Status: AC
  Administered 2018-11-17: 10 mg via ORAL
  Filled 2018-11-17: qty 1

## 2018-11-17 MED ORDER — METOCLOPRAMIDE HCL 10 MG PO TABS: 10.0000 mg | ORAL_TABLET | Freq: Three times a day (TID) | ORAL | 0 refills | Status: DC | PRN

## 2018-11-17 MED ORDER — FAMOTIDINE 20 MG PO TABS: 20.0000 mg | ORAL_TABLET | Freq: Two times a day (BID) | ORAL | 0 refills | Status: DC

## 2018-11-17 NOTE — ED Triage Notes (Signed)
Reports awakening with CP yesterday morning, center and to left side of chest, worsening with deep breaths. Pt alert and oriented X4, active, cooperative, pt in NAD. RR even and unlabored, color WNL.

## 2018-11-17 NOTE — ED Provider Notes (Signed)
[facility] Emergency Department Provider Note ____________________________________________   First MD Initiated Contact with Patient 11/17/18 1248     (approximate)  I have reviewed the triage vital signs and the nursing notes.   HISTORY  Chief Complaint Chest Pain and Shortness of Breath    HPI Ellen Richardson is a 33 y.o. female with PMH as noted below who presents with chest pain, acute onset yesterday, persistent course since then but waxing and waning in intensity, described as sharp but also pressure-like, nonradiating.  It is associated with nausea.  She denies vomiting, lightheadedness, or shortness of breath.  However, she states that the pain is somewhat worse if she tries to take a deep breath.  She denies any previous history of this pain.  She has no leg pain or swelling.  Past Medical History:  Diagnosis Date  . Anemia 2008   h/o    Patient Active Problem List   Diagnosis Date Noted  . Bursitis of right shoulder 04/01/2017  . Impingement syndrome of shoulder region 04/01/2017  . Sterilization consult 09/14/2013    Past Surgical History:  Procedure Laterality Date  . IUD REMOVAL  2018  . SHOULDER ARTHROSCOPY WITH SUBACROMIAL DECOMPRESSION Right 04/26/2017   Procedure: SHOULDER ARTHROSCOPY WITH SUBACROMIAL DECOMPRESSION;  Surgeon: Thornton Park, MD;  Location: [facility] ORS;  Service: Orthopedics;  Laterality: Right;    Prior to Admission medications   Medication Sig Start Date End Date Taking? Authorizing Provider  famotidine (PEPCID) 20 MG tablet Take 1 tablet (20 mg total) by mouth 2 (two) times daily for 15 days. 11/17/18 12/02/18  Arta Silence, MD  ibuprofen (ADVIL) 600 MG tablet Take by mouth. 05/10/16   [provider]  ibuprofen (ADVIL) 800 MG tablet ibuprofen 800 mg tablet  Take 1 tablet 3 times a day by oral route as needed.    [provider]  metoCLOPramide (REGLAN) 10 MG tablet Take 1 tablet (10 mg  total) by mouth every 8 (eight) hours as needed for up to 5 days for nausea. 11/17/18 11/22/18  Arta Silence, MD  ondansetron (ZOFRAN) 4 MG tablet Take 1 tablet (4 mg total) by mouth every 8 (eight) hours as needed for nausea or vomiting. 04/26/17   Thornton Park, MD  oxyCODONE (OXY IR/ROXICODONE) 5 MG immediate release tablet Take 1 tablet (5 mg total) by mouth every 4 (four) hours as needed for severe pain. 04/26/17   Thornton Park, MD    Allergies Patient has no known allergies.  No family history on file.  Social History Social History   Tobacco Use  . Smoking status: Never Smoker  . Smokeless tobacco: Never Used  Substance Use Topics  . Alcohol use: Yes    Comment: occ  . Drug use: No    Review of Systems  Constitutional: No fever. Eyes: No redness. ENT: No sore throat. Cardiovascular: Denies chest pain. Respiratory: Denies shortness of breath. Gastrointestinal: Positive for nausea.  No vomiting or diarrhea.  Genitourinary: Negative for flank pain.  Musculoskeletal: Negative for back pain. Skin: Negative for rash. Neurological: Negative for headache.   ____________________________________________   PHYSICAL EXAM:  VITAL SIGNS: ED Triage Vitals [11/17/18 1122]  Enc Vitals Group     BP 122/70     Pulse Rate 81     Resp 18     Temp 98.7 F (37.1 C)     Temp Source Oral     SpO2 98 %     Weight 240 lb (108.9 kg)  Height 5' (1.524 m)     Head Circumference      Peak Flow      Pain Score 8     Pain Loc      Pain Edu?      Excl. in GC?     Constitutional: Alert and oriented. Well appearing and in no acute distress. Eyes: Conjunctivae are normal.  Head: Atraumatic. Nose: No congestion/rhinnorhea. Mouth/Throat: Mucous membranes are moist.   Neck: Normal range of motion.  Cardiovascular: Normal rate, regular rhythm. Grossly normal heart sounds.  Good peripheral circulation.  Chest wall nontender. Respiratory: Normal respiratory effort.  No  retractions. Lungs CTAB. Gastrointestinal: No distention.  Musculoskeletal: Extremities warm and well perfused.  Neurologic:  Normal speech and language. No gross focal neurologic deficits are appreciated.  Skin:  Skin is warm and dry. No rash noted. Psychiatric: Mood and affect are normal. Speech and behavior are normal.  ____________________________________________   LABS (all labs ordered are listed, but only abnormal results are displayed)  Labs Reviewed  BASIC METABOLIC PANEL - Abnormal; Notable for the following components:      Result Value   CO2 20 (*)    Glucose, Bld 109 (*)    Calcium 8.5 (*)    All other components within normal limits  CBC - Abnormal; Notable for the following components:   Hemoglobin 11.3 (*)    HCT 33.5 (*)    All other components within normal limits  POC URINE PREG, ED  POCT PREGNANCY, URINE  TROPONIN I (HIGH SENSITIVITY)  TROPONIN I (HIGH SENSITIVITY)   ____________________________________________  EKG  ED ECG REPORT I, Dionne BucySebastian Kayti Poss, the attending physician, personally viewed and interpreted this ECG.  Date: 11/17/2018 EKG Time: 1121 Rate: 78 Rhythm: normal sinus rhythm QRS Axis: normal Intervals: normal ST/T Wave abnormalities: normal Narrative Interpretation: no evidence of acute ischemia  ____________________________________________  RADIOLOGY  CXR: No acute abnormality  ____________________________________________   PROCEDURES  Procedure(s) performed: No  Procedures  Critical Care performed: No ____________________________________________   INITIAL IMPRESSION / ASSESSMENT AND PLAN / ED COURSE  Pertinent labs & imaging results that were available during my care of the patient were reviewed by me and considered in my medical decision making (see chart for details).  33 year old female with PMH as noted above presents with atypical nonexertional chest pain since yesterday, associated with nausea.  It is  somewhat pleuritic and mainly sharp.  On exam, the patient is well-appearing and her vital signs are normal.  The physical exam is unremarkable.  EKG is nonischemic.  Overall presentation is consistent with benign etiology such as GERD, or less likely musculoskeletal pain.  The patient has no risk factors for ACS.  She is not on OCPs and has no other PE risk factors; she is PERC negative.  There is no evidence of vascular etiology.  Initial chest x-ray and labs are unremarkable.  We will obtain a second troponin, and give Pepcid and Reglan for symptomatic treatment.  ----------------------------------------- 3:01 PM on 11/17/2018 -----------------------------------------  Repeat troponin is negative.  The patient continues to appear comfortable and was asleep when I went to reassess her.  She is stable for discharge at this time.  Return precautions given, and she expresses understanding. ____________________________________________   FINAL CLINICAL IMPRESSION(S) / ED DIAGNOSES  Final diagnoses:  Atypical chest pain      NEW MEDICATIONS STARTED DURING THIS VISIT:  New Prescriptions   FAMOTIDINE (PEPCID) 20 MG TABLET    Take 1 tablet (20 mg total)  by mouth 2 (two) times daily for 15 days.   METOCLOPRAMIDE (REGLAN) 10 MG TABLET    Take 1 tablet (10 mg total) by mouth every 8 (eight) hours as needed for up to 5 days for nausea.     Note:  This document was prepared using Dragon voice recognition software and may include unintentional dictation errors.    Dionne BucySiadecki, Desaray Marschner, MD 11/17/18 1501

## 2018-11-17 NOTE — Discharge Instructions (Addendum)
Return to the ER for any new, worsening, or persistent severe chest pain, difficulty breathing, vomiting, or any other new or worsening symptoms that concern you.  You should take the Pepcid twice daily for the next 2 weeks, and you can take the Reglan as needed for nausea.

## 2018-11-18 NOTE — ED Notes (Signed)
Yesterday afternoon [facility] called due to pepsid is on backorder and not available .  Per dr saidecki, can change rx to  protonix 40 mg po daily for 2 weeks.  Pharmacy will change it.

## 2019-01-19 ENCOUNTER — Encounter: Payer: Self-pay | Admitting: Emergency Medicine

## 2019-01-19 ENCOUNTER — Emergency Department
Admission: EM | Admit: 2019-01-19 | Discharge: 2019-01-19 | Disposition: A | Discharge status: Home / Self Care | Payer: 59 | Attending: Emergency Medicine | Admitting: Emergency Medicine

## 2019-01-19 ENCOUNTER — Other Ambulatory Visit: Payer: Self-pay

## 2019-01-19 ENCOUNTER — Emergency Department: Payer: 59

## 2019-01-19 DIAGNOSIS — R519 Headache, unspecified: Secondary | ICD-10-CM

## 2019-01-19 DIAGNOSIS — K2970 Gastritis, unspecified, without bleeding: Secondary | ICD-10-CM | POA: Diagnosis not present

## 2019-01-19 DIAGNOSIS — R51 Headache: Secondary | ICD-10-CM | POA: Insufficient documentation

## 2019-01-19 DIAGNOSIS — D649 Anemia, unspecified: Secondary | ICD-10-CM | POA: Insufficient documentation

## 2019-01-19 LAB — CBC
HCT: 32.7 % — ABNORMAL LOW (ref 36.0–46.0)
Hemoglobin: 10.4 g/dL — ABNORMAL LOW (ref 12.0–15.0)
MCH: 27.1 pg (ref 26.0–34.0)
MCHC: 31.8 g/dL (ref 30.0–36.0)
MCV: 85.2 fL (ref 80.0–100.0)
Platelets: 243 10*3/uL (ref 150–400)
RBC: 3.84 MIL/uL — ABNORMAL LOW (ref 3.87–5.11)
RDW: 13.4 % (ref 11.5–15.5)
WBC: 5.1 10*3/uL (ref 4.0–10.5)
nRBC: 0 % (ref 0.0–0.2)

## 2019-01-19 LAB — URINALYSIS, COMPLETE (UACMP) WITH MICROSCOPIC
Bacteria, UA: NONE SEEN
Bilirubin Urine: NEGATIVE
Glucose, UA: NEGATIVE mg/dL
Hgb urine dipstick: NEGATIVE
Ketones, ur: NEGATIVE mg/dL
Leukocytes,Ua: NEGATIVE
Nitrite: NEGATIVE
Protein, ur: NEGATIVE mg/dL
Specific Gravity, Urine: 1.02 (ref 1.005–1.030)
pH: 6 (ref 5.0–8.0)

## 2019-01-19 LAB — COMPREHENSIVE METABOLIC PANEL
ALT: 9 U/L (ref 0–44)
AST: 11 U/L — ABNORMAL LOW (ref 15–41)
Albumin: 3.7 g/dL (ref 3.5–5.0)
Alkaline Phosphatase: 44 U/L (ref 38–126)
Anion gap: 8 (ref 5–15)
BUN: 9 mg/dL (ref 6–20)
CO2: 23 mmol/L (ref 22–32)
Calcium: 8.8 mg/dL — ABNORMAL LOW (ref 8.9–10.3)
Chloride: 106 mmol/L (ref 98–111)
Creatinine, Ser: 0.76 mg/dL (ref 0.44–1.00)
GFR calc Af Amer: >60 mL/min (ref >60)
GFR calc non Af Amer: >60 mL/min (ref >60)
Glucose, Bld: 97 mg/dL (ref 70–99)
Potassium: 4.2 mmol/L (ref 3.5–5.1)
Sodium: 137 mmol/L (ref 135–145)
Total Bilirubin: 0.2 mg/dL — ABNORMAL LOW (ref 0.3–1.2)
Total Protein: 7 g/dL (ref 6.5–8.1)

## 2019-01-19 LAB — LIPASE, BLOOD: Lipase: 28 U/L (ref 11–51)

## 2019-01-19 LAB — POCT PREGNANCY, URINE: Preg Test, Ur: NEGATIVE

## 2019-01-19 MED ORDER — SODIUM CHLORIDE 0.9% FLUSH: 3.0000 mL | Freq: Once | INTRAVENOUS | Status: DC

## 2019-01-19 MED ORDER — BUTALBITAL-APAP-CAFFEINE 50-325-40 MG PO TABS
2.0000 | ORAL_TABLET | Freq: Once | ORAL | Status: AC
  Administered 2019-01-19: 2 via ORAL
  Filled 2019-01-19: qty 2

## 2019-01-19 MED ORDER — BUTALBITAL-APAP-CAFFEINE 50-325-40 MG PO TABS: 1.0000 | ORAL_TABLET | Freq: Four times a day (QID) | ORAL | 0 refills | Status: DC | PRN

## 2019-01-19 MED ORDER — PANTOPRAZOLE SODIUM 40 MG PO TBEC: 40.0000 mg | DELAYED_RELEASE_TABLET | Freq: Every day | ORAL | 1 refills | Status: DC

## 2019-01-19 NOTE — ED Notes (Signed)
AAOx3.  Skin warm and dry.  NAD 

## 2019-01-19 NOTE — ED Provider Notes (Signed)
[facility] Emergency Department Provider Note  Time seen: 3:23 PM  I have reviewed the triage vital signs and the nursing notes.   HISTORY  Chief Complaint Abdominal Pain, Diarrhea, and Headache   HPI Ellen Richardson is a 33 y.o. female with a past medical history of anemia presents to the emergency department for headaches intermittent nausea and abdominal discomfort.  According to the patient for the past 4 weeks she has been experiencing daily headaches.  Denies history of headaches prior to this.  Patient denies any weakness or numbness confusion or slurred speech.  Patient states she has been taking ibuprofen every day multiple times a day for these headaches over the past 3 to 4 weeks.  Over the past week or so has now been experiencing upper abdominal discomfort with occasional nausea.  Denies any fever.  Denies any cough congestion or shortness of breath.  Past Medical History:  Diagnosis Date  . Anemia 2008   h/o    Patient Active Problem List   Diagnosis Date Noted  . Bursitis of right shoulder 04/01/2017  . Impingement syndrome of shoulder region 04/01/2017  . Sterilization consult 09/14/2013    Past Surgical History:  Procedure Laterality Date  . IUD REMOVAL  2018  . SHOULDER ARTHROSCOPY WITH SUBACROMIAL DECOMPRESSION Right 04/26/2017   Procedure: SHOULDER ARTHROSCOPY WITH SUBACROMIAL DECOMPRESSION;  Surgeon: Thornton Park, MD;  Location: [facility] ORS;  Service: Orthopedics;  Laterality: Right;    Prior to Admission medications   Medication Sig Start Date End Date Taking? Authorizing Provider  famotidine (PEPCID) 20 MG tablet Take 1 tablet (20 mg total) by mouth 2 (two) times daily for 15 days. 11/17/18 12/02/18  Arta Silence, MD  ibuprofen (ADVIL) 600 MG tablet Take by mouth. 05/10/16   [provider]  ibuprofen (ADVIL) 800 MG tablet ibuprofen 800 mg tablet  Take 1 tablet 3 times a day by oral route as needed.    [provider]  metoCLOPramide (REGLAN) 10 MG tablet Take 1 tablet (10 mg total) by mouth every 8 (eight) hours as needed for up to 5 days for nausea. 11/17/18 11/22/18  Arta Silence, MD  ondansetron (ZOFRAN) 4 MG tablet Take 1 tablet (4 mg total) by mouth every 8 (eight) hours as needed for nausea or vomiting. 04/26/17   Thornton Park, MD  oxyCODONE (OXY IR/ROXICODONE) 5 MG immediate release tablet Take 1 tablet (5 mg total) by mouth every 4 (four) hours as needed for severe pain. 04/26/17   Thornton Park, MD    No Known Allergies  No family history on file.  Social History Social History   Tobacco Use  . Smoking status: Never Smoker  . Smokeless tobacco: Never Used  Substance Use Topics  . Alcohol use: Yes    Comment: occ  . Drug use: No    Review of Systems Constitutional: Negative for fever. Cardiovascular: Negative for chest pain. Respiratory: Negative for shortness of breath. Gastrointestinal: Intermittent epigastric abdominal discomfort.  Positive for nausea.  Negative for vomiting or diarrhea. Genitourinary: Negative for urinary compaints Musculoskeletal: Negative for musculoskeletal complaints Skin: Negative for skin complaints  Neurological: Positive for headache. All other ROS negative  ____________________________________________   PHYSICAL EXAM:  VITAL SIGNS: ED Triage Vitals  Enc Vitals Group     BP 01/19/19 1401 123/82     Pulse Rate 01/19/19 1401 77     Resp 01/19/19 1401 18     Temp 01/19/19 1401 98.6 F (37 C)  Temp Source 01/19/19 1401 Oral     SpO2 01/19/19 1401 99 %     Weight 01/19/19 1402 226 lb (102.5 kg)     Height 01/19/19 1402 5' (1.524 m)     Head Circumference --      Peak Flow --      Pain Score 01/19/19 1405 7     Pain Loc --      Pain Edu? --      Excl. in GC? --    Constitutional: Alert and oriented. Well appearing and in no distress. Eyes: Normal exam ENT      Head: Normocephalic and atraumatic.       Mouth/Throat: Mucous membranes are moist. Cardiovascular: Normal rate, regular rhythm.  Respiratory: Normal respiratory effort without tachypnea nor retractions. Breath sounds are clear  Gastrointestinal: Soft and nontender. No distention.   Musculoskeletal: Nontender with normal range of motion in all extremities. Neurologic:  Normal speech and language. No gross focal neurologic deficits Skin:  Skin is warm, dry and intact.  Psychiatric: Mood and affect are normal.   ____________________________________________   RADIOLOGY  CT scan head is negative.  ____________________________________________   INITIAL IMPRESSION / ASSESSMENT AND PLAN / ED COURSE  Pertinent labs & imaging results that were available during my care of the patient were reviewed by me and considered in my medical decision making (see chart for details).   Patient presents emergency department for approximately 1 month of daily headaches now with approximately 1 week of upper abdominal discomfort nausea.  New onset of headaches is atypical for the patient.  Denies any history of brain imaging in the past.  We will obtain a CT scan of the head as a precaution given no history of migraines previously.  No fever.  No neck pain.  Patient's labs are largely nonrevealing including normal LFTs and lipase.  I highly suspect her abdominal discomfort is coming from gastritis due to daily use of ibuprofen over the past 1 month.  I discussed the patient to use Tylenol as needed for the headache and discontinue ibuprofen use.  We will also place the patient on Maalox and Protonix.  We will obtain CT scan of the head as a precaution rule out intracranial abnormality.  Patient agreeable to plan of care.  Patient's work-up is essentially negative including a normal CT scan.  Patient is feeling better after Fioricet.  We will discharge with Fioricet as well as Protonix and I have instructed the patient to stop ibuprofen use and we will use  Maalox for the next 7 days as needed.  Ellen Richardson was evaluated in Emergency Department on 01/19/2019 for the symptoms described in the history of present illness. She was evaluated in the context of the global COVID-19 pandemic, which necessitated consideration that the patient might be at risk for infection with the SARS-CoV-2 virus that causes COVID-19. Institutional protocols and algorithms that pertain to the evaluation of patients at risk for COVID-19 are in a state of rapid change based on information released by regulatory bodies including the CDC and federal and state organizations. These policies and algorithms were followed during the patient's care in the ED.  ____________________________________________   FINAL CLINICAL IMPRESSION(S) / ED DIAGNOSES  Headache Gastritis   Minna Antis, MD 01/19/19 1621

## 2019-01-19 NOTE — ED Notes (Signed)
Pt c/o HA x 2 months, c/o lower abdominal pain that is intermittent since Labor Day with some nausea. Pt is A&O x 4, NAD noted at this time, respirations even and unlabored, skin warm, dry, and intact, ambulatory from the lobby to treatment room without difficulty. Pt denies tenderness with palpation, is ambulatory without difficulty at this time. Pt also c/o urinary frequency that started on Saturday. Pt states last BM earlier today.

## 2019-01-19 NOTE — ED Triage Notes (Signed)
Pt presents to ED via POV with c/o HA x 2 months, and intermittent abdominal pain and diarrhea since Labor day. Pt states urinary frequency that started Saturday, c/o nausea, denies vomiting.

## 2019-05-06 ENCOUNTER — Other Ambulatory Visit: Payer: Self-pay

## 2019-05-06 DIAGNOSIS — F17200 Nicotine dependence, unspecified, uncomplicated: Secondary | ICD-10-CM | POA: Diagnosis not present

## 2019-05-06 DIAGNOSIS — R300 Dysuria: Secondary | ICD-10-CM | POA: Diagnosis not present

## 2019-05-06 DIAGNOSIS — R350 Frequency of micturition: Secondary | ICD-10-CM | POA: Diagnosis not present

## 2019-05-07 ENCOUNTER — Encounter: Payer: Self-pay | Admitting: Emergency Medicine

## 2019-05-07 ENCOUNTER — Emergency Department
Admission: EM | Admit: 2019-05-07 | Discharge: 2019-05-07 | Disposition: A | Discharge status: Home / Self Care | Payer: BC Managed Care – PPO | Attending: Emergency Medicine | Admitting: Emergency Medicine

## 2019-05-07 DIAGNOSIS — R300 Dysuria: Secondary | ICD-10-CM

## 2019-05-07 HISTORY — DX: Urinary tract infection, site not specified: N39.0

## 2019-05-07 LAB — URINALYSIS, COMPLETE (UACMP) WITH MICROSCOPIC
Bacteria, UA: NONE SEEN
Bilirubin Urine: NEGATIVE
Glucose, UA: NEGATIVE mg/dL
Ketones, ur: NEGATIVE mg/dL
Leukocytes,Ua: NEGATIVE
Nitrite: NEGATIVE
Protein, ur: NEGATIVE mg/dL
RBC / HPF: >50 RBC/hpf — ABNORMAL HIGH (ref 0–5)
Specific Gravity, Urine: 1.016 (ref 1.005–1.030)
pH: 7 (ref 5.0–8.0)

## 2019-05-07 LAB — POCT PREGNANCY, URINE: Preg Test, Ur: NEGATIVE

## 2019-05-07 MED ORDER — CIPROFLOXACIN HCL 500 MG PO TABS: 500.0000 mg | ORAL_TABLET | Freq: Two times a day (BID) | ORAL | 0 refills | Status: AC

## 2019-05-07 NOTE — ED Provider Notes (Signed)
[facility] Emergency Department Provider Note ________   First MD Initiated Contact with Patient 05/07/19 0410     (approximate)  I have reviewed the triage vital signs and the nursing notes.   HISTORY  Chief Complaint Dysuria and Urinary Frequency    HPI Ellen Richardson is a 34 y.o. female with previous history of multiple episodes of urinary tract infection presents to the emergency department secondary to urinary frequency urgency and dysuria since Sunday.  Patient states that she has been taking amoxicillin for the past 3 days without any improvement.  Patient also states that she has been taking Azo without improvement.  Patient denies any vaginal complaints.  Patient states that she sees her gynecologist annually last evaluation was in June of this year.        Past Medical History:  Diagnosis Date  . UTI (urinary tract infection)     There are no problems to display for this patient.   Past Surgical History:  Procedure Laterality Date  . SHOULDER SURGERY      Prior to Admission medications   Not on File    Allergies Patient has no known allergies.  No family history on file.  Social History Social History   Tobacco Use  . Smoking status: Current Some Day Smoker  . Smokeless tobacco: Never Used  Substance Use Topics  . Alcohol use: Yes  . Drug use: Not on file    Review of Systems Constitutional: No fever/chills Eyes: No visual changes. ENT: No sore throat. Cardiovascular: Denies chest pain. Respiratory: Denies shortness of breath. Gastrointestinal: No abdominal pain.  No nausea, no vomiting.  No diarrhea.  No constipation. Genitourinary: Positive for dysuria. Musculoskeletal: Negative for neck pain.  Negative for back pain. Integumentary: Negative for rash. Neurological: Negative for headaches, focal weakness or numbness.   ____________________________________________   PHYSICAL EXAM:  VITAL SIGNS: ED Triage  Vitals  Enc Vitals Group     BP 05/06/19 2351 (!) 151/103     Pulse Rate 05/06/19 2351 83     Resp 05/06/19 2351 18     Temp 05/06/19 2351 98.5 F (36.9 C)     Temp Source 05/06/19 2351 Oral     SpO2 05/06/19 2351 100 %     Weight 05/06/19 2353 103.4 kg (228 lb)     Height 05/06/19 2353 1.524 m (5')     Head Circumference --      Peak Flow --      Pain Score 05/07/19 0010 7     Pain Loc --      Pain Edu? --      Excl. in [place]? --     Constitutional: Alert and oriented.  Eyes: Conjunctivae are normal.  Mouth/Throat: Patient is wearing a mask. Neck: No stridor.  No meningeal signs.   Cardiovascular: Normal rate, regular rhythm. Good peripheral circulation. Grossly normal heart sounds. Respiratory: Normal respiratory effort.  No retractions. Gastrointestinal: Soft and nontender. No distention.  Musculoskeletal: No lower extremity tenderness nor edema. No gross deformities of extremities. Neurologic:  Normal speech and language. No gross focal neurologic deficits are appreciated.  Skin:  Skin is warm, dry and intact. Psychiatric: Mood and affect are normal. Speech and behavior are normal.  ____________________________________________   LABS (all labs ordered are listed, but only abnormal results are displayed)  Labs Reviewed  URINALYSIS, COMPLETE (UACMP) WITH MICROSCOPIC - Abnormal; Notable for the following components:      Result Value   Color, Urine  YELLOW (*)    APPearance HAZY (*)    Hgb urine dipstick LARGE (*)    RBC / HPF >50 (*)    All other components within normal limits  URINE CULTURE  POCT PREGNANCY, URINE    Procedures   ____________________________________________   INITIAL IMPRESSION / MDM / ASSESSMENT AND PLAN / ED COURSE  As part of my medical decision making, I reviewed the following data within the electronic MEDICAL RECORD NUMBER   34 year old female presented with above-stated history and physical exam with differential diagnosis including UTI less  likely urethritis.  Urinalysis not consistent with a urinary tract infection however this may be due to a partially treated infection as the patient has been taken amoxicillin at home.  Patient states that previous urine cultures were resistant to Bactrim but has always been susceptible to Cipro which she is taking without adverse sequelae.  ____________________________________________  FINAL CLINICAL IMPRESSION(S) / ED DIAGNOSES  Final diagnoses:  Dysuria     MEDICATIONS GIVEN DURING THIS VISIT:  Medications - No data to display   ED Discharge Orders    None      *Please note:  [name] was evaluated in Emergency Department on 05/07/2019 for the symptoms described in the history of present illness. She was evaluated in the context of the global COVID-19 pandemic, which necessitated consideration that the patient might be at risk for infection with the SARS-CoV-2 virus that causes COVID-19. Institutional protocols and algorithms that pertain to the evaluation of patients at risk for COVID-19 are in a state of rapid change based on information released by regulatory bodies including the CDC and federal and state organizations. These policies and algorithms were followed during the patient's care in the ED.  Some ED evaluations and interventions may be delayed as a result of limited staffing during the pandemic.*  Note:  This document was prepared using Dragon voice recognition software and may include unintentional dictation errors.   Darci Current, MD 05/07/19 [phone]

## 2019-05-07 NOTE — ED Triage Notes (Signed)
Pt presents to ED with urinary frequency and dysuria since Sunday. otc medications taken without relief. symptoms worsening. Hx of frequent UTI's. Denies fever or vomiting.

## 2019-05-08 LAB — URINE CULTURE: Culture: <10000 — AB

## 2019-07-20 ENCOUNTER — Ambulatory Visit: Payer: Self-pay | Admitting: Adult Health

## 2019-07-21 ENCOUNTER — Telehealth: Payer: Self-pay

## 2019-07-21 NOTE — Telephone Encounter (Signed)
Confirmed appointment on 07/23/2019 and screened for covid. klh  °

## 2019-07-23 ENCOUNTER — Other Ambulatory Visit: Payer: Self-pay

## 2019-07-23 ENCOUNTER — Encounter: Payer: Self-pay | Admitting: Adult Health

## 2019-07-23 ENCOUNTER — Ambulatory Visit: Payer: BC Managed Care – PPO | Admitting: Adult Health

## 2019-07-23 VITALS — BP 129/85 | HR 81 | Temp 97.5°F | Resp 16 | Ht 60.0 in | Wt 234.0 lb

## 2019-07-23 DIAGNOSIS — N926 Irregular menstruation, unspecified: Secondary | ICD-10-CM

## 2019-07-23 DIAGNOSIS — Z7689 Persons encountering health services in other specified circumstances: Secondary | ICD-10-CM

## 2019-07-23 DIAGNOSIS — G43009 Migraine without aura, not intractable, without status migrainosus: Secondary | ICD-10-CM | POA: Diagnosis not present

## 2019-07-23 MED ORDER — BUTALBITAL-APAP-CAFFEINE 50-325-40 MG PO TABS: 1.0000 | ORAL_TABLET | Freq: Four times a day (QID) | ORAL | 0 refills | Status: AC | PRN

## 2019-07-23 MED ORDER — IBUPROFEN 800 MG PO TABS: ORAL_TABLET | ORAL | 3 refills | Status: DC

## 2019-07-23 MED ORDER — FAMOTIDINE 20 MG PO TABS: 20.0000 mg | ORAL_TABLET | Freq: Two times a day (BID) | ORAL | 0 refills | Status: DC

## 2019-07-23 NOTE — Progress Notes (Signed)
New Patient Office Visit  Subjective:  Patient ID: Ellen Richardson, female    DOB: [birth year]  Age: 34 y.o. MRN: [id number]  CC:  Chief Complaint  Patient presents with  . New Patient (Initial Visit)    HPI [name] presents for establishment of new PCP. Her and her family recently moved to the area from [place] and would like to establish care here. Her husband and their 4 children all moved to the area. 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 Her and her husband have been together for 14 years and married for 3 years. Her parents are both still alive. Her father in the last few months has been experiencing TIA's, also has history of DM and HTN. Her mother has a history of DM. In describing her medical history she explains she has been suffering from migraines in the last year but in the last few months they have become more frequent. Recently was seen in the ED due to severe migraine that produced N/V. Has been treating her migraines with ibuprofen. In the last few months she reports having 3-4 migraines per week. They cause her to be sensitive to light and sound. She is being followed by orthopaedics due to a right shoulder injury that required surgical repair in 2018. She recently began experiencing more pain with limited movement. She is currently having PT 2X per week. Her and her husband would like to have another child. They are seeking out family planning services and have their first appointment in April. Her husband has had a vasectomy in the past. She had her IUD surgically removed in 2018 after implantation to the uterine wall causing infection. Since removal she continues to have irregular menses.   Past Medical History:  Diagnosis Date  . Anemia 2008   h/o  . UTI (urinary tract infection)     Past Surgical History:  Procedure Laterality Date  . IUD REMOVAL  2018  . SHOULDER ARTHROSCOPY WITH SUBACROMIAL DECOMPRESSION Right  04/26/2017   Procedure: SHOULDER ARTHROSCOPY WITH SUBACROMIAL DECOMPRESSION;  Surgeon: Juanell Fairly, MD;  Location: [facility] ORS;  Service: Orthopedics;  Laterality: Right;  . SHOULDER SURGERY      Family History  Problem Relation Age of Onset  . Hypertension Father   . Diabetes Father     Social History   Socioeconomic History  . Marital status: Married    Spouse name: Not on file  . Number of children: Not on file  . Years of education: Not on file  . Highest education level: Not on file  Occupational History  . Not on file  Tobacco Use  . Smoking status: Never Smoker  . Smokeless tobacco: Never Used  Substance and Sexual Activity  . Alcohol use: Yes    Comment: occ  . Drug use: No  . Sexual activity: Not on file  Other Topics Concern  . Not on file  Social History Narrative   ** Merged History Encounter **       Social Determinants of Health   Financial Resource Strain:   . Difficulty of Paying Living Expenses:   Food Insecurity:   . Worried About Programme researcher, broadcasting/film/video in the Last Year:   . Barista in the Last Year:   Transportation Needs:   . Freight forwarder (Medical):   Marland Kitchen Lack of Transportation (Non-Medical):   Physical Activity:   . Days of Exercise per Week:   .  Minutes of Exercise per Session:   Stress:   . Feeling of Stress :   Social Connections:   . Frequency of Communication with Friends and Family:   . Frequency of Social Gatherings with Friends and Family:   . Attends Religious Services:   . Active Member of Clubs or Organizations:   . Attends Archivist Meetings:   Marland Kitchen Marital Status:   Intimate Partner Violence:   . Fear of Current or Ex-Partner:   . Emotionally Abused:   Marland Kitchen Physically Abused:   . Sexually Abused:     ROS Review of Systems  Constitutional: Negative for chills, fatigue and unexpected weight change.  HENT: Negative for congestion, rhinorrhea, sneezing and sore throat.   Eyes: Negative for  photophobia, pain and redness.  Respiratory: Negative for cough, chest tightness and shortness of breath.   Cardiovascular: Negative for chest pain and palpitations.  Gastrointestinal: Negative for abdominal pain, constipation, diarrhea, nausea and vomiting.  Endocrine: Negative.   Genitourinary: Negative for dysuria and frequency.  Musculoskeletal: Negative for arthralgias, back pain, joint swelling and neck pain.  Skin: Negative for rash.  Allergic/Immunologic: Negative.   Neurological: Negative for tremors and numbness.  Hematological: Negative for adenopathy. Does not bruise/bleed easily.  Psychiatric/Behavioral: Negative for behavioral problems and sleep disturbance. The patient is not nervous/anxious.     Objective:   Today's Vitals: BP 129/85   Pulse 81   Temp (!) 97.5 F (36.4 C)   Resp 16   Ht 5' (1.524 m)   Wt 234 lb (106.1 kg)   LMP 07/14/2019   SpO2 99%   BMI 45.70 kg/m   Physical Exam Vitals and nursing note reviewed.  Constitutional:      General: She is not in acute distress.    Appearance: She is well-developed. She is not diaphoretic.  HENT:     Head: Normocephalic and atraumatic.     Mouth/Throat:     Pharynx: No oropharyngeal exudate.  Eyes:     Pupils: Pupils are equal, round, and reactive to light.  Neck:     Thyroid: No thyromegaly.     Vascular: No JVD.     Trachea: No tracheal deviation.  Cardiovascular:     Rate and Rhythm: Normal rate and regular rhythm.     Heart sounds: Normal heart sounds. No murmur. No friction rub. No gallop.   Pulmonary:     Effort: Pulmonary effort is normal. No respiratory distress.     Breath sounds: Normal breath sounds. No wheezing or rales.  Chest:     Chest wall: No tenderness.  Abdominal:     Palpations: Abdomen is soft.     Tenderness: There is no abdominal tenderness. There is no guarding.  Musculoskeletal:        General: Normal range of motion.     Cervical back: Normal range of motion and neck  supple.  Lymphadenopathy:     Cervical: No cervical adenopathy.  Skin:    General: Skin is warm and dry.  Neurological:     Mental Status: She is alert and oriented to person, place, and time.     Cranial Nerves: No cranial nerve deficit.  Psychiatric:        Behavior: Behavior normal.        Thought Content: Thought content normal.        Judgment: Judgment normal.     Assessment & Plan:  1. Establishing care with new doctor, encounter for Have baseline labs done, and  follow up accordingly.  - CBC with Differential/Platelet - Lipid Panel With LDL/HDL Ratio - TSH - T4, free - Comprehensive metabolic panel - S93 and Folate Panel - Fe+TIBC+Fer - Vitamin D (25 hydroxy)  2. Migraine without aura and without status migrainosus, not intractable Uses NSAID at this time with some success.  Discussed emgality, and gave her sample.  -Fioricet  3. Irregular menses Patient is scheduled to see fertility specialist to attempt pregnancy.   Problem List Items Addressed This Visit    None    Visit Diagnoses    Establishing care with new doctor, encounter for    -  Primary   Relevant Orders   CBC with Differential/Platelet (Completed)   Lipid Panel With LDL/HDL Ratio (Completed)   TSH (Completed)   T4, free (Completed)   Comprehensive metabolic panel (Completed)   B12 and Folate Panel (Completed)   Fe+TIBC+Fer (Completed)   Vitamin D (25 hydroxy) (Completed)      Outpatient Encounter Medications as of 07/23/2019  Medication Sig  . [DISCONTINUED] ibuprofen (ADVIL) 800 MG tablet ibuprofen 800 mg tablet  Take 1 tablet 3 times a day by oral route as needed.  . [DISCONTINUED] ibuprofen (ADVIL) 800 MG tablet ibuprofen 800 mg tablet  Take 1 tablet 3 times a day by oral route as needed.  . butalbital-acetaminophen-caffeine (FIORICET) 50-325-40 MG tablet Take 1-2 tablets by mouth every 6 (six) hours as needed for headache.  . famotidine (PEPCID) 20 MG tablet Take 1 tablet (20 mg total)  by mouth 2 (two) times daily for 15 days.  . [DISCONTINUED] butalbital-acetaminophen-caffeine (FIORICET) 50-325-40 MG tablet Take 1-2 tablets by mouth every 6 (six) hours as needed for headache. (Patient not taking: Reported on 07/23/2019)  . [DISCONTINUED] famotidine (PEPCID) 20 MG tablet Take 1 tablet (20 mg total) by mouth 2 (two) times daily for 15 days.  . [DISCONTINUED] ibuprofen (ADVIL) 600 MG tablet Take by mouth.  . [DISCONTINUED] metoCLOPramide (REGLAN) 10 MG tablet Take 1 tablet (10 mg total) by mouth every 8 (eight) hours as needed for up to 5 days for nausea.  . [DISCONTINUED] ondansetron (ZOFRAN) 4 MG tablet Take 1 tablet (4 mg total) by mouth every 8 (eight) hours as needed for nausea or vomiting. (Patient not taking: Reported on 07/23/2019)  . [DISCONTINUED] oxyCODONE (OXY IR/ROXICODONE) 5 MG immediate release tablet Take 1 tablet (5 mg total) by mouth every 4 (four) hours as needed for severe pain. (Patient not taking: Reported on 07/23/2019)  . [DISCONTINUED] pantoprazole (PROTONIX) 40 MG tablet Take 1 tablet (40 mg total) by mouth daily. (Patient not taking: Reported on 07/23/2019)   No facility-administered encounter medications on file as of 07/23/2019.    Follow-up: Return in about 3 weeks (around 08/13/2019) for Physical, follow up.   Johnna Acosta, NP

## 2019-08-11 ENCOUNTER — Encounter: Payer: Self-pay | Admitting: Adult Health

## 2019-08-11 ENCOUNTER — Ambulatory Visit
Admission: RE | Admit: 2019-08-11 | Discharge: 2019-08-11 | Disposition: A | Discharge status: Home / Self Care | Payer: BC Managed Care – PPO | Source: Ambulatory Visit | Attending: Adult Health | Admitting: Adult Health

## 2019-08-11 ENCOUNTER — Ambulatory Visit: Payer: BC Managed Care – PPO | Admitting: Adult Health

## 2019-08-11 ENCOUNTER — Other Ambulatory Visit: Payer: Self-pay | Admitting: Orthopedic Surgery

## 2019-08-11 ENCOUNTER — Other Ambulatory Visit: Payer: Self-pay

## 2019-08-11 VITALS — BP 135/82 | HR 82 | Temp 97.5°F | Resp 16 | Ht 62.0 in | Wt 236.0 lb

## 2019-08-11 DIAGNOSIS — N390 Urinary tract infection, site not specified: Secondary | ICD-10-CM

## 2019-08-11 DIAGNOSIS — R319 Hematuria, unspecified: Secondary | ICD-10-CM | POA: Diagnosis not present

## 2019-08-11 DIAGNOSIS — M7551 Bursitis of right shoulder: Secondary | ICD-10-CM

## 2019-08-11 DIAGNOSIS — M79671 Pain in right foot: Secondary | ICD-10-CM | POA: Insufficient documentation

## 2019-08-11 DIAGNOSIS — Z9889 Other specified postprocedural states: Secondary | ICD-10-CM

## 2019-08-11 DIAGNOSIS — R300 Dysuria: Secondary | ICD-10-CM | POA: Diagnosis not present

## 2019-08-11 LAB — POCT URINALYSIS DIPSTICK
Bilirubin, UA: NEGATIVE
Glucose, UA: NEGATIVE
Ketones, UA: NEGATIVE
Nitrite, UA: NEGATIVE
Protein, UA: POSITIVE — AB
Spec Grav, UA: 1.01 (ref 1.010–1.025)
Urobilinogen, UA: 0.2 E.U./dL
pH, UA: 5 (ref 5.0–8.0)

## 2019-08-11 MED ORDER — NITROFURANTOIN MONOHYD MACRO 100 MG PO CAPS: 100.0000 mg | ORAL_CAPSULE | Freq: Two times a day (BID) | ORAL | 0 refills | Status: DC

## 2019-08-11 NOTE — Progress Notes (Signed)
[facility] [address] [place], [place] [address]  Internal MEDICINE  Office Visit Note  Patient Name: Ellen Richardson  [id number]  [id number]  Date of Service: 08/11/2019  Chief Complaint  Patient presents with  . Urinary Tract Infection  . Headache     HPI Pt is here for a sick visit. She reports 3 days ago she noticed urinary urgency. It has progressed and she feels like she is starting UTi.  She had 2 cipro pills left over and took one last night, and this morning.  She does have tendon issues in her shoulder and foot.        Current Medication:  Outpatient Encounter Medications as of 08/11/2019  Medication Sig  . butalbital-acetaminophen-caffeine (FIORICET) 50-325-40 MG tablet Take 1-2 tablets by mouth every 6 (six) hours as needed for headache.  . famotidine (PEPCID) 20 MG tablet Take 1 tablet (20 mg total) by mouth 2 (two) times daily for 15 days.  Marland Kitchen ibuprofen (ADVIL) 800 MG tablet ibuprofen 800 mg tablet  Take 1 tablet 3 times a day by oral route as needed.   No facility-administered encounter medications on file as of 08/11/2019.      Medical History: Past Medical History:  Diagnosis Date  . Anemia 2008   h/o  . UTI (urinary tract infection)      Vital Signs: BP 135/82   Pulse 82   Temp (!) 97.5 F (36.4 C)   Resp 16   Ht 5\' 2"  (1.575 m)   Wt 236 lb (107 kg)   SpO2 100%   BMI 43.16 kg/m    Review of Systems  Constitutional: Negative for chills, fatigue and unexpected weight change.  HENT: Negative for congestion, rhinorrhea, sneezing and sore throat.   Eyes: Negative for photophobia, pain and redness.  Respiratory: Negative for cough, chest tightness and shortness of breath.   Cardiovascular: Negative for chest pain and palpitations.  Gastrointestinal: Negative for abdominal pain, constipation, diarrhea, nausea and vomiting.  Endocrine: Negative.   Genitourinary: Negative for dysuria and frequency.  Musculoskeletal: Negative for  arthralgias, back pain, joint swelling and neck pain.  Skin: Negative for rash.  Allergic/Immunologic: Negative.   Neurological: Negative for tremors and numbness.  Hematological: Negative for adenopathy. Does not bruise/bleed easily.  Psychiatric/Behavioral: Negative for behavioral problems and sleep disturbance. The patient is not nervous/anxious.     Physical Exam Vitals and nursing note reviewed.  Constitutional:      General: She is not in acute distress.    Appearance: She is well-developed. She is not diaphoretic.  HENT:     Head: Normocephalic and atraumatic.     Mouth/Throat:     Pharynx: No oropharyngeal exudate.  Eyes:     Pupils: Pupils are equal, round, and reactive to light.  Neck:     Thyroid: No thyromegaly.     Vascular: No JVD.     Trachea: No tracheal deviation.  Cardiovascular:     Rate and Rhythm: Normal rate and regular rhythm.     Heart sounds: Normal heart sounds. No murmur. No friction rub. No gallop.   Pulmonary:     Effort: Pulmonary effort is normal. No respiratory distress.     Breath sounds: Normal breath sounds. No wheezing or rales.  Chest:     Chest wall: No tenderness.  Abdominal:     Palpations: Abdomen is soft.     Tenderness: There is no abdominal tenderness. There is no guarding.  Musculoskeletal:  General: Normal range of motion.     Cervical back: Normal range of motion and neck supple.  Lymphadenopathy:     Cervical: No cervical adenopathy.  Skin:    General: Skin is warm and dry.  Neurological:     Mental Status: She is alert and oriented to person, place, and time.     Cranial Nerves: No cranial nerve deficit.  Psychiatric:        Behavior: Behavior normal.        Thought Content: Thought content normal.        Judgment: Judgment normal.    Assessment/Plan: 1. Right foot pain Will get Xray and follow up when results available.  - DG Foot Complete Right; Future  2. Urinary tract infection with hematuria, site  unspecified Advised patient to take entire course of antibiotics as prescribed with food. Pt should return to clinic in 7-10 days if symptoms fail to improve or new symptoms develop.  - nitrofurantoin, macrocrystal-monohydrate, (MACROBID) 100 MG capsule; Take 1 capsule (100 mg total) by mouth 2 (two) times daily.  Dispense: 20 capsule; Refill: 0 - CULTURE, URINE COMPREHENSIVE  3. Dysuria - POCT Urinalysis Dipstick  General Counseling: Ellen Richardson verbalizes understanding of the findings of todays visit and agrees with plan of treatment. I have discussed any further diagnostic evaluation that may be needed or ordered today. We also reviewed her medications today. she has been encouraged to call the office with any questions or concerns that should arise related to todays visit.   Orders Placed This Encounter  Procedures  . POCT Urinalysis Dipstick    No orders of the defined types were placed in this encounter.    Time spent: 25 Minutes  This patient was seen by Orson Gear AGNP-C in Collaboration with Dr Lavera Guise as a part of collaborative care agreement.  Kendell Bane AGNP-C Internal Medicine

## 2019-08-12 LAB — LIPID PANEL WITH LDL/HDL RATIO
Cholesterol, Total: 140 mg/dL (ref 100–199)
HDL: 39 mg/dL — ABNORMAL LOW (ref >39)
LDL Chol Calc (NIH): 88 mg/dL (ref 0–99)
LDL/HDL Ratio: 2.3 ratio (ref 0.0–3.2)
Triglycerides: 64 mg/dL (ref 0–149)
VLDL Cholesterol Cal: 13 mg/dL (ref 5–40)

## 2019-08-12 LAB — CBC WITH DIFFERENTIAL/PLATELET
Basophils Absolute: 0 10*3/uL (ref 0.0–0.2)
Basos: 0 %
EOS (ABSOLUTE): 0 10*3/uL (ref 0.0–0.4)
Eos: 1 %
Hematocrit: 32.7 % — ABNORMAL LOW (ref 34.0–46.6)
Hemoglobin: 11.1 g/dL (ref 11.1–15.9)
Immature Grans (Abs): 0 10*3/uL (ref 0.0–0.1)
Immature Granulocytes: 0 %
Lymphocytes Absolute: 2 10*3/uL (ref 0.7–3.1)
Lymphs: 43 %
MCH: 27.9 pg (ref 26.6–33.0)
MCHC: 33.9 g/dL (ref 31.5–35.7)
MCV: 82 fL (ref 79–97)
Monocytes Absolute: 0.2 10*3/uL (ref 0.1–0.9)
Monocytes: 5 %
Neutrophils Absolute: 2.4 10*3/uL (ref 1.4–7.0)
Neutrophils: 51 %
Platelets: 232 10*3/uL (ref 150–450)
RBC: 3.98 x10E6/uL (ref 3.77–5.28)
RDW: 13.6 % (ref 11.7–15.4)
WBC: 4.7 10*3/uL (ref 3.4–10.8)

## 2019-08-12 LAB — IRON,TIBC AND FERRITIN PANEL
Ferritin: 37 ng/mL (ref 15–150)
Iron Saturation: 15 % (ref 15–55)
Iron: 40 ug/dL (ref 27–159)
Total Iron Binding Capacity: 264 ug/dL (ref 250–450)
UIBC: 224 ug/dL (ref 131–425)

## 2019-08-12 LAB — TSH: TSH: 2.07 u[IU]/mL (ref 0.450–4.500)

## 2019-08-12 LAB — COMPREHENSIVE METABOLIC PANEL
ALT: 7 IU/L (ref 0–32)
AST: 10 IU/L (ref 0–40)
Albumin/Globulin Ratio: 1.3 (ref 1.2–2.2)
Albumin: 3.9 g/dL (ref 3.8–4.8)
Alkaline Phosphatase: 56 IU/L (ref 39–117)
BUN/Creatinine Ratio: 15 (ref 9–23)
BUN: 11 mg/dL (ref 6–20)
Bilirubin Total: <0.2 mg/dL (ref 0.0–1.2)
CO2: 20 mmol/L (ref 20–29)
Calcium: 8.9 mg/dL (ref 8.7–10.2)
Chloride: 106 mmol/L (ref 96–106)
Creatinine, Ser: 0.74 mg/dL (ref 0.57–1.00)
GFR calc Af Amer: 125 mL/min/{1.73_m2} (ref >59)
GFR calc non Af Amer: 108 mL/min/{1.73_m2} (ref >59)
Globulin, Total: 3.1 g/dL (ref 1.5–4.5)
Glucose: 102 mg/dL — ABNORMAL HIGH (ref 65–99)
Potassium: 4.3 mmol/L (ref 3.5–5.2)
Sodium: 137 mmol/L (ref 134–144)
Total Protein: 7 g/dL (ref 6.0–8.5)

## 2019-08-12 LAB — VITAMIN D 25 HYDROXY (VIT D DEFICIENCY, FRACTURES): Vit D, 25-Hydroxy: 9.3 ng/mL — ABNORMAL LOW (ref 30.0–100.0)

## 2019-08-12 LAB — T4, FREE: Free T4: 1.14 ng/dL (ref 0.82–1.77)

## 2019-08-12 LAB — B12 AND FOLATE PANEL
Folate: 5.3 ng/mL (ref >3.0)
Vitamin B-12: 1366 pg/mL — ABNORMAL HIGH (ref 232–1245)

## 2019-08-13 ENCOUNTER — Ambulatory Visit (INDEPENDENT_AMBULATORY_CARE_PROVIDER_SITE_OTHER): Payer: BC Managed Care – PPO | Admitting: Adult Health

## 2019-08-13 ENCOUNTER — Other Ambulatory Visit: Payer: Self-pay

## 2019-08-13 ENCOUNTER — Encounter: Payer: Self-pay | Admitting: Adult Health

## 2019-08-13 VITALS — BP 128/80 | HR 86 | Temp 97.8°F | Resp 16 | Ht 62.0 in | Wt 237.0 lb

## 2019-08-13 DIAGNOSIS — M79671 Pain in right foot: Secondary | ICD-10-CM | POA: Diagnosis not present

## 2019-08-13 DIAGNOSIS — Z0001 Encounter for general adult medical examination with abnormal findings: Secondary | ICD-10-CM

## 2019-08-13 DIAGNOSIS — E559 Vitamin D deficiency, unspecified: Secondary | ICD-10-CM

## 2019-08-13 DIAGNOSIS — Z8669 Personal history of other diseases of the nervous system and sense organs: Secondary | ICD-10-CM

## 2019-08-13 MED ORDER — VITAMIN D (ERGOCALCIFEROL) 1.25 MG (50000 UNIT) PO CAPS: 50000.0000 [IU] | ORAL_CAPSULE | ORAL | 0 refills | Status: DC

## 2019-08-13 NOTE — Progress Notes (Signed)
[facility] [address] [place], [place] [address]  Internal MEDICINE  Office Visit Note  Patient Name: Ellen Richardson  [id number]  [id number]  Date of Service: 08/13/2019  Chief Complaint  Patient presents with  . Annual Exam  . Anemia     HPI Pt is here for routine health maintenance examination.  She is a well appearing 34 yo AA female.  She is doing well.  Denies any current issues.  Her labs are reviewed with her currently.  Her vit D is low and will be treated at this visit.     Current Medication: Outpatient Encounter Medications as of 08/13/2019  Medication Sig  . butalbital-acetaminophen-caffeine (FIORICET) 50-325-40 MG tablet Take 1-2 tablets by mouth every 6 (six) hours as needed for headache.  . ibuprofen (ADVIL) 800 MG tablet ibuprofen 800 mg tablet  Take 1 tablet 3 times a day by oral route as needed.  . nitrofurantoin, macrocrystal-monohydrate, (MACROBID) 100 MG capsule Take 1 capsule (100 mg total) by mouth 2 (two) times daily.  . famotidine (PEPCID) 20 MG tablet Take 1 tablet (20 mg total) by mouth 2 (two) times daily for 15 days.  . Vitamin D, Ergocalciferol, (DRISDOL) 1.25 MG (50000 UNIT) CAPS capsule Take 1 capsule (50,000 Units total) by mouth every 7 (seven) days.  . [DISCONTINUED] Vitamin D, Ergocalciferol, (DRISDOL) 1.25 MG (50000 UNIT) CAPS capsule Take 1 capsule (50,000 Units total) by mouth every 7 (seven) days.   No facility-administered encounter medications on file as of 08/13/2019.    Surgical History: Past Surgical History:  Procedure Laterality Date  . IUD REMOVAL  2018  . SHOULDER ARTHROSCOPY WITH SUBACROMIAL DECOMPRESSION Right 04/26/2017   Procedure: SHOULDER ARTHROSCOPY WITH SUBACROMIAL DECOMPRESSION;  Surgeon: Juanell Fairly, MD;  Location: [facility] ORS;  Service: Orthopedics;  Laterality: Right;  . SHOULDER SURGERY      Medical History: Past Medical History:  Diagnosis Date  . Anemia 2008   h/o  . UTI (urinary tract  infection)     Family History: Family History  Problem Relation Age of Onset  . Hypertension Father   . Diabetes Father       Review of Systems  Constitutional: Negative for chills, fatigue and unexpected weight change.  HENT: Negative for congestion, rhinorrhea, sneezing and sore throat.   Eyes: Negative for photophobia, pain and redness.  Respiratory: Negative for cough, chest tightness and shortness of breath.   Cardiovascular: Negative for chest pain and palpitations.  Gastrointestinal: Negative for abdominal pain, constipation, diarrhea, nausea and vomiting.  Endocrine: Negative.   Genitourinary: Negative for dysuria and frequency.  Musculoskeletal: Negative for arthralgias, back pain, joint swelling and neck pain.  Skin: Negative for rash.  Allergic/Immunologic: Negative.   Neurological: Negative for tremors and numbness.  Hematological: Negative for adenopathy. Does not bruise/bleed easily.  Psychiatric/Behavioral: Negative for behavioral problems and sleep disturbance. The patient is not nervous/anxious.      Vital Signs: BP 128/80   Pulse 86   Temp 97.8 F (36.6 C)   Resp 16   Ht 5\' 2"  (1.575 m)   Wt 237 lb (107.5 kg)   LMP 07/14/2019   SpO2 96%   BMI 43.35 kg/m    Physical Exam Vitals and nursing note reviewed.  Constitutional:      General: She is not in acute distress.    Appearance: She is well-developed. She is not diaphoretic.  HENT:     Head: Normocephalic and atraumatic.     Mouth/Throat:  Pharynx: No oropharyngeal exudate.  Eyes:     Pupils: Pupils are equal, round, and reactive to light.  Neck:     Thyroid: No thyromegaly.     Vascular: No JVD.     Trachea: No tracheal deviation.  Cardiovascular:     Rate and Rhythm: Normal rate and regular rhythm.     Heart sounds: Normal heart sounds. No murmur. No friction rub. No gallop.   Pulmonary:     Effort: Pulmonary effort is normal. No respiratory distress.     Breath sounds: Normal  breath sounds. No wheezing or rales.  Chest:     Chest wall: No tenderness.     Breasts:        Right: Normal.        Left: Normal.     Comments: Exam Chaperoned by Golda Acre CMA Abdominal:     Palpations: Abdomen is soft.     Tenderness: There is no abdominal tenderness. There is no guarding.  Musculoskeletal:        General: Normal range of motion.     Cervical back: Normal range of motion and neck supple.  Lymphadenopathy:     Cervical: No cervical adenopathy.  Skin:    General: Skin is warm and dry.  Neurological:     Mental Status: She is alert and oriented to person, place, and time.     Cranial Nerves: No cranial nerve deficit.  Psychiatric:        Behavior: Behavior normal.        Thought Content: Thought content normal.        Judgment: Judgment normal.      LABS: Recent Results (from the past 2160 hour(s))  CBC with Differential/Platelet     Status: Abnormal   Collection Time: 08/11/19 10:39 AM  Result Value Ref Range   WBC 4.7 3.4 - 10.8 x10E3/uL   RBC 3.98 3.77 - 5.28 x10E6/uL   Hemoglobin 11.1 11.1 - 15.9 g/dL   Hematocrit 38.1 (L) 82.9 - 46.6 %   MCV 82 79 - 97 fL   MCH 27.9 26.6 - 33.0 pg   MCHC 33.9 31.5 - 35.7 g/dL   RDW 93.7 16.9 - 67.8 %   Platelets 232 150 - 450 x10E3/uL   Neutrophils 51 Not Estab. %   Lymphs 43 Not Estab. %   Monocytes 5 Not Estab. %   Eos 1 Not Estab. %   Basos 0 Not Estab. %   Neutrophils Absolute 2.4 1.4 - 7.0 x10E3/uL   Lymphocytes Absolute 2.0 0.7 - 3.1 x10E3/uL   Monocytes Absolute 0.2 0.1 - 0.9 x10E3/uL   EOS (ABSOLUTE) 0.0 0.0 - 0.4 x10E3/uL   Basophils Absolute 0.0 0.0 - 0.2 x10E3/uL   Immature Granulocytes 0 Not Estab. %   Immature Grans (Abs) 0.0 0.0 - 0.1 x10E3/uL  Lipid Panel With LDL/HDL Ratio     Status: Abnormal   Collection Time: 08/11/19 10:39 AM  Result Value Ref Range   Cholesterol, Total 140 100 - 199 mg/dL   Triglycerides 64 0 - 149 mg/dL   HDL 39 (L) >93 mg/dL   VLDL Cholesterol Cal 13 5 - 40  mg/dL   LDL Chol Calc (NIH) 88 0 - 99 mg/dL   LDL/HDL Ratio 2.3 0.0 - 3.2 ratio    Comment:  LDL/HDL Ratio                                             Men  Women                               1/2 Avg.Risk  1.0    1.5                                   Avg.Risk  3.6    3.2                                2X Avg.Risk  6.2    5.0                                3X Avg.Risk  8.0    6.1   TSH     Status: None   Collection Time: 08/11/19 10:39 AM  Result Value Ref Range   TSH 2.070 0.450 - 4.500 uIU/mL  T4, free     Status: None   Collection Time: 08/11/19 10:39 AM  Result Value Ref Range   Free T4 1.14 0.82 - 1.77 ng/dL  Comprehensive metabolic panel     Status: Abnormal   Collection Time: 08/11/19 10:39 AM  Result Value Ref Range   Glucose 102 (H) 65 - 99 mg/dL   BUN 11 6 - 20 mg/dL   Creatinine, Ser 5.36 0.57 - 1.00 mg/dL   GFR calc non Af Amer 108 >59 mL/min/1.73   GFR calc Af Amer 125 >59 mL/min/1.73   BUN/Creatinine Ratio 15 9 - 23   Sodium 137 134 - 144 mmol/L   Potassium 4.3 3.5 - 5.2 mmol/L   Chloride 106 96 - 106 mmol/L   CO2 20 20 - 29 mmol/L   Calcium 8.9 8.7 - 10.2 mg/dL   Total Protein 7.0 6.0 - 8.5 g/dL   Albumin 3.9 3.8 - 4.8 g/dL   Globulin, Total 3.1 1.5 - 4.5 g/dL   Albumin/Globulin Ratio 1.3 1.2 - 2.2   Bilirubin Total <0.2 0.0 - 1.2 mg/dL   Alkaline Phosphatase 56 39 - 117 IU/L   AST 10 0 - 40 IU/L   ALT 7 0 - 32 IU/L  B12 and Folate Panel     Status: Abnormal   Collection Time: 08/11/19 10:39 AM  Result Value Ref Range   Vitamin B-12 1,366 (H) 232 - 1,245 pg/mL   Folate 5.3 >3.0 ng/mL    Comment: A serum folate concentration of less than 3.1 ng/mL is considered to represent clinical deficiency.   Fe+TIBC+Fer     Status: None   Collection Time: 08/11/19 10:39 AM  Result Value Ref Range   Total Iron Binding Capacity 264 250 - 450 ug/dL   UIBC 644 034 - 742 ug/dL   Iron 40 27 - 595 ug/dL   Iron Saturation 15 15 - 55 %    Ferritin 37 15 - 150 ng/mL  Vitamin D (25 hydroxy)     Status: Abnormal   Collection Time: 08/11/19 10:39 AM  Result Value Ref Range   Vit D, 25-Hydroxy  9.3 (L) 30.0 - 100.0 ng/mL    Comment: Vitamin D deficiency has been defined by the [place] practice guideline as a level of serum 25-OH vitamin D less than 20 ng/mL (1,2). The Endocrine Society went on to further define vitamin D insufficiency as a level between 21 and 29 ng/mL (2). 1. IOM (Institute of Medicine). 2010. Dietary reference    intakes for calcium and D. [place]: The    [employer]. 2. Holick MF, Binkley [place], Bischoff-Ferrari HA, et al.    Evaluation, treatment, and prevention of vitamin D    deficiency: an Endocrine Society clinical practice    guideline. JCEM. 2011 Jul; 96(7):1911-30.   POCT Urinalysis Dipstick     Status: Abnormal   Collection Time: 08/11/19  1:57 PM  Result Value Ref Range   Color, UA     Clarity, UA     Glucose, UA Negative Negative   Bilirubin, UA Negative    Ketones, UA Negative    Spec Grav, UA 1.010 1.010 - 1.025   Blood, UA small    pH, UA 5.0 5.0 - 8.0   Protein, UA Positive (A) Negative   Urobilinogen, UA 0.2 0.2 or 1.0 E.U./dL   Nitrite, UA negative    Leukocytes, UA Small (1+) (A) Negative   Appearance     Odor    CULTURE, URINE COMPREHENSIVE     Status: None (Preliminary result)   Collection Time: 08/11/19  2:10 PM   Specimen: Urine   URINE  Result Value Ref Range   Urine Culture, Comprehensive Preliminary report    Organism ID, Bacteria Comment     Comment: No growth after 18-24 hours.    Assessment/Plan: 1. Encounter for general adult medical examination with abnormal findings Up to date on PHM  2. Vitamin D deficiency Take Vit D as discussed.  - Vitamin D, Ergocalciferol, (DRISDOL) 1.25 MG (50000 UNIT) CAPS capsule; Take 1 capsule (50,000 Units total) by mouth every 7 (seven) days.  Dispense: 10 capsule;  Refill: 0  3. Right foot pain Discussed R.I.C.E.  Follow up in office if new or worsening symptoms.  Or if symptoms fail to improve.    4. Hx of migraine headaches Emgality trial currently.   General Counseling: Johann verbalizes understanding of the findings of todays visit and agrees with plan of treatment. I have discussed any further diagnostic evaluation that may be needed or ordered today. We also reviewed her medications today. she has been encouraged to call the office with any questions or concerns that should arise related to todays visit.   No orders of the defined types were placed in this encounter.   Meds ordered this encounter  Medications  . DISCONTD: Vitamin D, Ergocalciferol, (DRISDOL) 1.25 MG (50000 UNIT) CAPS capsule    Sig: Take 1 capsule (50,000 Units total) by mouth every 7 (seven) days.    Dispense:  10 capsule    Refill:  0  . Vitamin D, Ergocalciferol, (DRISDOL) 1.25 MG (50000 UNIT) CAPS capsule    Sig: Take 1 capsule (50,000 Units total) by mouth every 7 (seven) days.    Dispense:  10 capsule    Refill:  0    Time spent: 30 Minutes   This patient was seen by Orson Gear AGNP-C in Collaboration with Dr Lavera Guise as a part of collaborative care agreement    Kendell Bane AGNP-C Internal Medicine

## 2019-08-15 LAB — CULTURE, URINE COMPREHENSIVE

## 2019-08-25 ENCOUNTER — Ambulatory Visit: Admission: RE | Admit: 2019-08-25 | Payer: BC Managed Care – PPO | Source: Ambulatory Visit

## 2019-09-10 ENCOUNTER — Other Ambulatory Visit: Payer: Self-pay | Admitting: Orthopedic Surgery

## 2019-09-10 DIAGNOSIS — M25511 Pain in right shoulder: Secondary | ICD-10-CM

## 2019-09-13 ENCOUNTER — Ambulatory Visit: Payer: BC Managed Care – PPO

## 2019-09-19 ENCOUNTER — Other Ambulatory Visit: Payer: Self-pay

## 2019-09-19 ENCOUNTER — Emergency Department
Admission: EM | Admit: 2019-09-19 | Discharge: 2019-09-19 | Disposition: A | Discharge status: Home / Self Care | Payer: BC Managed Care – PPO | Attending: Emergency Medicine | Admitting: Emergency Medicine

## 2019-09-19 ENCOUNTER — Emergency Department: Payer: BC Managed Care – PPO

## 2019-09-19 DIAGNOSIS — Y999 Unspecified external cause status: Secondary | ICD-10-CM | POA: Insufficient documentation

## 2019-09-19 DIAGNOSIS — Y929 Unspecified place or not applicable: Secondary | ICD-10-CM | POA: Insufficient documentation

## 2019-09-19 DIAGNOSIS — W540XXA Bitten by dog, initial encounter: Secondary | ICD-10-CM | POA: Diagnosis not present

## 2019-09-19 DIAGNOSIS — Y9389 Activity, other specified: Secondary | ICD-10-CM | POA: Insufficient documentation

## 2019-09-19 DIAGNOSIS — S61552A Open bite of left wrist, initial encounter: Secondary | ICD-10-CM | POA: Diagnosis not present

## 2019-09-19 MED ORDER — AMOXICILLIN-POT CLAVULANATE 875-125 MG PO TABS: 1.0000 | ORAL_TABLET | Freq: Two times a day (BID) | ORAL | 0 refills | Status: DC

## 2019-09-19 MED ORDER — OXYCODONE-ACETAMINOPHEN 5-325 MG PO TABS
1.0000 | ORAL_TABLET | Freq: Once | ORAL | Status: AC
  Administered 2019-09-19: 1 via ORAL
  Filled 2019-09-19: qty 1

## 2019-09-19 MED ORDER — CEFTRIAXONE SODIUM 1 G IJ SOLR
1.0000 g | Freq: Once | INTRAMUSCULAR | Status: AC
  Administered 2019-09-19: 1 g via INTRAMUSCULAR
  Filled 2019-09-19: qty 10

## 2019-09-19 MED ORDER — HYDROCODONE-ACETAMINOPHEN 5-325 MG PO TABS: 1.0000 | ORAL_TABLET | Freq: Four times a day (QID) | ORAL | 0 refills | Status: AC | PRN

## 2019-09-19 MED ORDER — AMOXICILLIN-POT CLAVULANATE 875-125 MG PO TABS: 1.0000 | ORAL_TABLET | Freq: Two times a day (BID) | ORAL | 0 refills | Status: AC

## 2019-09-19 NOTE — ED Provider Notes (Signed)
Emergency Department Provider Note  ____________________________________________  Time seen: Approximately 10:48 PM  I have reviewed the triage vital signs and the nursing notes.   HISTORY  Chief Complaint Animal Bite   Historian Patient     HPI Ellen Richardson is a 34 y.o. female presents to the emergency department with puncture wounds along the volar aspect of the left wrist after patient was bitten by her own dog.  Patient states that she was trying to break up a fight and dog bit her by accident.  Dog has known rabies negative status.  Patient states that she has intense pain with attempted range of motion at the left wrist.  No numbness or tingling in the left fingers. No other alleviating measures have been attempted.    Past Medical History:  Diagnosis Date  . Anemia 2008   h/o  . UTI (urinary tract infection)      Immunizations up to date:  Yes.     Past Medical History:  Diagnosis Date  . Anemia 2008   h/o  . UTI (urinary tract infection)     Patient Active Problem List   Diagnosis Date Noted  . Bursitis of right shoulder 04/01/2017  . Impingement syndrome of shoulder region 04/01/2017  . Sterilization consult 09/14/2013    Past Surgical History:  Procedure Laterality Date  . IUD REMOVAL  2018  . SHOULDER ARTHROSCOPY WITH SUBACROMIAL DECOMPRESSION Right 04/26/2017   Procedure: SHOULDER ARTHROSCOPY WITH SUBACROMIAL DECOMPRESSION;  Surgeon: Juanell Fairly, MD;  Location: [facility] ORS;  Service: Orthopedics;  Laterality: Right;  . SHOULDER SURGERY      Prior to Admission medications   Medication Sig Start Date End Date Taking? Authorizing Provider  butalbital-acetaminophen-caffeine (FIORICET) 50-325-40 MG tablet Take 1-2 tablets by mouth every 6 (six) hours as needed for headache. 07/23/19 07/22/20  Johnna Acosta, NP  famotidine (PEPCID) 20 MG tablet Take 1 tablet (20 mg total) by mouth 2 (two) times daily for 15 days. 07/23/19 08/07/19  Johnna Acosta, NP  ibuprofen (ADVIL) 800 MG tablet ibuprofen 800 mg tablet  Take 1 tablet 3 times a day by oral route as needed. 07/23/19   Johnna Acosta, NP  nitrofurantoin, macrocrystal-monohydrate, (MACROBID) 100 MG capsule Take 1 capsule (100 mg total) by mouth 2 (two) times daily. 08/11/19   Johnna Acosta, NP  Vitamin D, Ergocalciferol, (DRISDOL) 1.25 MG (50000 UNIT) CAPS capsule Take 1 capsule (50,000 Units total) by mouth every 7 (seven) days. 08/13/19   Johnna Acosta, NP    Allergies Patient has no known allergies.  Family History  Problem Relation Age of Onset  . Hypertension Father   . Diabetes Father     Social History Social History   Tobacco Use  . Smoking status: Never Smoker  . Smokeless tobacco: Never Used  Substance Use Topics  . Alcohol use: Yes    Comment: occ  . Drug use: No     Review of Systems  Constitutional: No fever/chills Eyes:  No discharge ENT: No upper respiratory complaints. Respiratory: no cough. No SOB/ use of accessory muscles to breath Gastrointestinal:   No nausea, no vomiting.  No diarrhea.  No constipation. Musculoskeletal: Negative for musculoskeletal pain. Skin: Patient has dog bite wounds.   ____________________________________________   PHYSICAL EXAM:  VITAL SIGNS: ED Triage Vitals  Enc Vitals Group     BP 09/19/19 2004 114/65     Pulse Rate 09/19/19 2004 94     Resp 09/19/19 2004 18  Temp 09/19/19 2004 97.7 F (36.5 C)     Temp Source 09/19/19 2004 Oral     SpO2 --      Weight 09/19/19 2005 235 lb (106.6 kg)     Height 09/19/19 2005 5' (1.524 m)     Head Circumference --      Peak Flow --      Pain Score --      Pain Loc --      Pain Edu? --      Excl. in [place]? --      Constitutional: Alert and oriented. Well appearing and in no acute distress. Eyes: Conjunctivae are normal. PERRL. EOMI. Head: Atraumatic. Cardiovascular: Normal rate, regular rhythm. Normal S1 and S2.  Good peripheral circulation. Respiratory:  Normal respiratory effort without tachypnea or retractions. Lungs CTAB. Good air entry to the bases with no decreased or absent breath sounds Gastrointestinal: Bowel sounds x 4 quadrants. Soft and nontender to palpation. No guarding or rigidity. No distention. Musculoskeletal: Full range of motion to all extremities. No obvious deformities noted. Neurologic:  Normal for age. No gross focal neurologic deficits are appreciated.  Patient can spread her left fingers.  She can perform flexion at the IP joint of the left thumb.  She can perform an okay sign. Skin: Patient has approximately 6, 1 cm lacerations and puncture wounds along the volar aspect of the left wrist.  Dog bite wounds are deep to underlying adipose tissue. Psychiatric: Mood and affect are normal for age. Speech and behavior are normal.   ____________________________________________   LABS (all labs ordered are listed, but only abnormal results are displayed)  Labs Reviewed - No data to display ____________________________________________  EKG   ____________________________________________  RADIOLOGY Unk Pinto, personally viewed and evaluated these images (plain radiographs) as part of my medical decision making, as well as reviewing the written report by the radiologist.    DG Wrist Complete Left  Result Date: 09/19/2019 CLINICAL DATA:  Status post dog bite. EXAM: LEFT WRIST - COMPLETE 3+ VIEW COMPARISON:  None. FINDINGS: There is no evidence of fracture or dislocation. There is no evidence of arthropathy or other focal bone abnormality. A moderate amount of soft tissue air is seen along the dorsal and volar aspects of the left wrist. IMPRESSION: Moderate amount of soft tissue air along the dorsal and volar aspects of the left wrist, without evidence of acute osseous abnormality. Electronically Signed   By: Virgina Norfolk M.D.   On: 09/19/2019 22:49     ____________________________________________    PROCEDURES  Procedure(s) performed:     Marland KitchenMarland KitchenLaceration Repair  Date/Time: 09/19/2019 10:52 PM Performed by: Lannie Fields, PA-C Authorized by: Lannie Fields, PA-C   Consent:    Consent obtained:  Verbal   Consent given by:  Patient Anesthesia (see MAR for exact dosages):    Anesthesia method:  None Laceration details:    Location:  Hand   Hand location:  L wrist   Wound length (cm): 6, 1 cm puncture wounds    Depth (mm):  10 Repair type:    Repair type:  Simple Exploration:    Contaminated: no   Treatment:    Area cleansed with:  Betadine   Amount of cleaning:  Standard   Irrigation solution:  Sterile saline   Irrigation volume:  500   Irrigation method:  Syringe   Visualized foreign bodies/material removed: no   Skin repair:    Repair method: steri strips  Approximation:  Approximation:  Close Post-procedure details:    Dressing:  Open (no dressing)   Patient tolerance of procedure:  Tolerated well, no immediate complications       Medications  cefTRIAXone (ROCEPHIN) injection 1 g (has no administration in time range)     ____________________________________________   INITIAL IMPRESSION / ASSESSMENT AND PLAN / ED COURSE  Pertinent labs & imaging results that were available during my care of the patient were reviewed by me and considered in my medical decision making (see chart for details).      Assessment and Plan:  Dog Bite  34 year old female presents to the emergency department with multiple dog bite wounds along the volar aspect of the left wrist after patient was trying to break up a dog fight and was bitten.  Vital signs are reassuring at triage.  On physical exam, sensation was intact.  Patient did have pain with range of motion testing at the left wrist but was able to perform full range of motion.  She was able to move all five left fingers.  She could perform opposition, flexion at  the IP joint of the left thumb and can spread her left fingers.  There was no evidence of fractures or retained teeth on x-ray of the left wrist.  Patient's wounds were irrigated copiously with normal saline and Betadine.  I explained to patient that dog bite wounds are extremely susceptible to infection and would have to be left open.  Patient's wounds were dressed using Steri-Strips.  Patient was given Percocet in the emergency department for pain.  She was given an injection of Rocephin and she was discharged with Augmentin.  Patient was advised to take antibiotic until completion.  I cautioned patient that there was a high risk for infection without compliance to antibiotic with dog bite wounds.  She was given strict return precautions to return with redness or streaking surrounding wound site.  Recommended reporting incident to [facility] police.  Dog has known rabies negative status and patient does not wish to initiate rabies series at this time.  Patient was placed in a Velcro wrist splint prior to discharge.     ____________________________________________  FINAL CLINICAL IMPRESSION(S) / ED DIAGNOSES  Final diagnoses:  None      NEW MEDICATIONS STARTED DURING THIS VISIT:  ED Discharge Orders    None          This chart was dictated using voice recognition software/Dragon. Despite best efforts to proofread, errors can occur which can change the meaning. Any change was purely unintentional. '    Gasper Lloyd 09/19/19 2259    Concha Se, MD 09/20/19 1044

## 2019-09-19 NOTE — ED Notes (Signed)
Right hand is soaking in NS/betadine solution per J. Joseph Art, PA-C. Wounds to left wrist were cleaned with same solution. NS at bedside to rinse.

## 2019-09-19 NOTE — ED Triage Notes (Signed)
Patient reports that children had agitated dog and it bite her.  Patient with bite to right middle finger and left wrist area.

## 2019-09-19 NOTE — ED Notes (Signed)
First nurse note: Pt to the er via ems for dog bite, puncture wound to the 3rd digit fingernail right hand and puncture wounds to the left forearm. Pt was bit by her own dog

## 2019-09-19 NOTE — Discharge Instructions (Signed)
Take Augmentin twice daily for the next 10 days. You have been prescribed Norco for pain. Please make follow-up appointment with Dr. Allena Katz for left wrist pain. Return to the emergency department with redness or streaking surrounding wound site.

## 2019-09-24 ENCOUNTER — Ambulatory Visit: Payer: BC Managed Care – PPO

## 2019-09-29 ENCOUNTER — Other Ambulatory Visit: Payer: Self-pay

## 2019-09-29 ENCOUNTER — Ambulatory Visit
Admission: RE | Admit: 2019-09-29 | Discharge: 2019-09-29 | Disposition: A | Discharge status: Home / Self Care | Payer: BC Managed Care – PPO | Source: Ambulatory Visit | Attending: Orthopedic Surgery | Admitting: Orthopedic Surgery

## 2019-09-29 DIAGNOSIS — M7551 Bursitis of right shoulder: Secondary | ICD-10-CM | POA: Insufficient documentation

## 2019-09-29 DIAGNOSIS — Z9889 Other specified postprocedural states: Secondary | ICD-10-CM | POA: Insufficient documentation

## 2019-09-29 DIAGNOSIS — M25511 Pain in right shoulder: Secondary | ICD-10-CM

## 2019-09-29 MED ORDER — SODIUM CHLORIDE (PF) 0.9 % IJ SOLN
10.0000 mL | Freq: Once | INTRAMUSCULAR | Status: AC
  Administered 2019-09-29: 10 mL

## 2019-09-29 MED ORDER — IOHEXOL 300 MG/ML  SOLN
7.0000 mL | Freq: Once | INTRAMUSCULAR | Status: AC | PRN
  Administered 2019-09-29: 7 mL

## 2019-09-29 MED ORDER — LIDOCAINE HCL (PF) 1 % IJ SOLN
5.0000 mL | Freq: Once | INTRAMUSCULAR | Status: AC
  Administered 2019-09-29: 5 mL
  Filled 2019-09-29: qty 5

## 2019-09-29 MED ORDER — GADOBUTROL 1 MMOL/ML IV SOLN
0.0100 mL | Freq: Once | INTRAVENOUS | Status: AC | PRN
  Administered 2019-09-29: 0.01 mL

## 2019-10-09 ENCOUNTER — Telehealth: Payer: Self-pay

## 2019-10-09 NOTE — Telephone Encounter (Signed)
Confirmed appointment on 10/13/2019 and screened for covid. [facility]  

## 2019-10-13 ENCOUNTER — Ambulatory Visit: Payer: BC Managed Care – PPO | Admitting: Adult Health

## 2019-10-22 ENCOUNTER — Telehealth: Payer: Self-pay

## 2019-10-22 NOTE — Telephone Encounter (Signed)
BILLED MISSED APPOINTMENT FEE 10/13/2019

## 2020-04-16 ENCOUNTER — Other Ambulatory Visit: Payer: Self-pay

## 2020-04-16 ENCOUNTER — Emergency Department
Admission: EM | Admit: 2020-04-16 | Discharge: 2020-04-17 | Disposition: A | Discharge status: Home / Self Care | Payer: Self-pay | Attending: Emergency Medicine | Admitting: Emergency Medicine

## 2020-04-16 ENCOUNTER — Emergency Department: Payer: Self-pay

## 2020-04-16 DIAGNOSIS — G43809 Other migraine, not intractable, without status migrainosus: Secondary | ICD-10-CM | POA: Insufficient documentation

## 2020-04-16 MED ORDER — KETOROLAC TROMETHAMINE 30 MG/ML IJ SOLN
30.0000 mg | Freq: Once | INTRAMUSCULAR | Status: AC
  Administered 2020-04-16: 30 mg via INTRAVENOUS
  Filled 2020-04-16: qty 1

## 2020-04-16 MED ORDER — SODIUM CHLORIDE 0.9 % IV BOLUS
1000.0000 mL | Freq: Once | INTRAVENOUS | Status: AC
  Administered 2020-04-16: 1000 mL via INTRAVENOUS

## 2020-04-16 MED ORDER — DIPHENHYDRAMINE HCL 50 MG/ML IJ SOLN
25.0000 mg | Freq: Once | INTRAMUSCULAR | Status: AC
  Administered 2020-04-16: 25 mg via INTRAVENOUS
  Filled 2020-04-16: qty 1

## 2020-04-16 MED ORDER — ONDANSETRON HCL 4 MG/2ML IJ SOLN
4.0000 mg | Freq: Once | INTRAMUSCULAR | Status: AC
  Administered 2020-04-16: 4 mg via INTRAVENOUS
  Filled 2020-04-16: qty 2

## 2020-04-16 MED ORDER — METHYLPREDNISOLONE SODIUM SUCC 125 MG IJ SOLR
125.0000 mg | Freq: Once | INTRAMUSCULAR | Status: AC
  Administered 2020-04-16: 125 mg via INTRAVENOUS
  Filled 2020-04-16: qty 2

## 2020-04-16 NOTE — ED Provider Notes (Signed)
[facility] Emergency Department Provider Note  ____________________________________________  Time seen: Approximately 11:34 PM  I have reviewed the triage vital signs and the nursing notes.   HISTORY  Chief Complaint Headache    HPI Ellen Richardson is a 34 y.o. female that presents to the emergency department for evaluation of frontal headache for on and off for 2 weeks.  Patient states that headache is throbbing across her forehead.  Patient states that she will take Motrin and it will calm down and go away but will return.  She does have a history of migraines. She does get regular headaches.  She got new glasses a month ago hoping they would help. Patient states that she just got off of a 12-hour shift and has to be back to work early in the morning. She has never seen neurology. No trauma.  No dizziness, visual changes, photophobia, nausea, vomiting.   Past Medical History:  Diagnosis Date  . Anemia 2008   h/o  . UTI (urinary tract infection)     Patient Active Problem List   Diagnosis Date Noted  . Bursitis of right shoulder 04/01/2017  . Impingement syndrome of shoulder region 04/01/2017  . Sterilization consult 09/14/2013    Past Surgical History:  Procedure Laterality Date  . IUD REMOVAL  2018  . SHOULDER ARTHROSCOPY WITH SUBACROMIAL DECOMPRESSION Right 04/26/2017   Procedure: SHOULDER ARTHROSCOPY WITH SUBACROMIAL DECOMPRESSION;  Surgeon: Juanell Fairly, MD;  Location: [facility] ORS;  Service: Orthopedics;  Laterality: Right;  . SHOULDER SURGERY      Prior to Admission medications   Medication Sig Start Date End Date Taking? Authorizing Provider  butalbital-acetaminophen-caffeine (FIORICET) 50-325-40 MG tablet Take 1-2 tablets by mouth every 6 (six) hours as needed for headache. 07/23/19 07/22/20  Johnna Acosta, NP  famotidine (PEPCID) 20 MG tablet Take 1 tablet (20 mg total) by mouth 2 (two) times daily for 15 days. 07/23/19 08/07/19  Johnna Acosta, NP  ibuprofen (ADVIL) 800 MG tablet ibuprofen 800 mg tablet  Take 1 tablet 3 times a day by oral route as needed. 07/23/19   Johnna Acosta, NP  naproxen (NAPROSYN) 500 MG tablet Take 1 tablet (500 mg total) by mouth 2 (two) times daily with a meal. 04/17/20 04/17/21  Enid Derry, PA-C  nitrofurantoin, macrocrystal-monohydrate, (MACROBID) 100 MG capsule Take 1 capsule (100 mg total) by mouth 2 (two) times daily. 08/11/19   Johnna Acosta, NP  Vitamin D, Ergocalciferol, (DRISDOL) 1.25 MG (50000 UNIT) CAPS capsule Take 1 capsule (50,000 Units total) by mouth every 7 (seven) days. 08/13/19   Johnna Acosta, NP    Allergies Patient has no known allergies.  Family History  Problem Relation Age of Onset  . Hypertension Father   . Diabetes Father     Social History Social History   Tobacco Use  . Smoking status: Never Smoker  . Smokeless tobacco: Never Used  Vaping Use  . Vaping Use: Never used  Substance Use Topics  . Alcohol use: Yes    Comment: occ  . Drug use: No     Review of Systems  Constitutional: No fever/chills ENT: No upper respiratory complaints. Cardiovascular: No chest pain. Respiratory: No cough. No SOB. Gastrointestinal: No abdominal pain.  No nausea, no vomiting.  Musculoskeletal: Negative for musculoskeletal pain. Skin: Negative for rash, abrasions, lacerations, ecchymosis. Neurological: Positive for headache.   ____________________________________________   PHYSICAL EXAM:  VITAL SIGNS: ED Triage Vitals  Enc Vitals Group  BP 04/16/20 2000 (!) 150/98     Pulse Rate 04/16/20 2000 72     Resp 04/16/20 2000 16     Temp 04/16/20 2000 98.2 F (36.8 C)     Temp Source 04/16/20 2000 Oral     SpO2 04/16/20 2000 100 %     Weight 04/16/20 2001 238 lb (108 kg)     Height 04/16/20 2001 5' (1.524 m)     Head Circumference --      Peak Flow --      Pain Score 04/16/20 2001 8     Pain Loc --      Pain Edu? --      Excl. in GC? --       Constitutional: Alert and oriented. Well appearing and in no acute distress. Eyes: Conjunctivae are normal. PERRL. EOMI. Head: Atraumatic. ENT:      Ears:      Nose: No congestion/rhinnorhea.      Mouth/Throat: Mucous membranes are moist.  Neck: No stridor.   Cardiovascular: Normal rate, regular rhythm.  Good peripheral circulation. Respiratory: Normal respiratory effort without tachypnea or retractions. Lungs CTAB. Good air entry to the bases with no decreased or absent breath sounds. Gastrointestinal: Bowel sounds 4 quadrants. Soft and nontender to palpation. No guarding or rigidity. No palpable masses. No distention.  Musculoskeletal: Full range of motion to all extremities. No gross deformities appreciated. Neurologic:  Normal speech and language. No gross focal neurologic deficits are appreciated.  Skin:  Skin is warm, dry and intact. No rash noted. Psychiatric: Mood and affect are normal. Speech and behavior are normal. Patient exhibits appropriate insight and judgement.   ____________________________________________   LABS (all labs ordered are listed, but only abnormal results are displayed)  Labs Reviewed - No data to display ____________________________________________  EKG   ____________________________________________  RADIOLOGY Lexine Baton, personally viewed and evaluated these images (plain radiographs) as part of my medical decision making, as well as reviewing the written report by the radiologist.  CT Head Wo Contrast  Result Date: 04/16/2020 CLINICAL DATA:  Several weeks of headache, incompletely resolved with over-the-counter medications. EXAM: CT HEAD WITHOUT CONTRAST TECHNIQUE: Contiguous axial images were obtained from the base of the skull through the vertex without intravenous contrast. COMPARISON:  CT 01/19/2019 FINDINGS: Brain: No evidence of acute infarction, hemorrhage, hydrocephalus, extra-axial collection, visible mass lesion or mass  effect. Vascular: No hyperdense vessel or unexpected calcification. Skull: No calvarial fracture or suspicious osseous lesion. No scalp swelling or hematoma. Sinuses/Orbits: Paranasal sinuses and mastoid air cells are predominantly clear. Included orbital structures are unremarkable. Other: None. IMPRESSION: Normal noncontrast head CT. Electronically Signed   By: Kreg Shropshire M.D.   On: 04/16/2020 21:18    ____________________________________________    PROCEDURES  Procedure(s) performed:    Procedures    Medications  sodium chloride 0.9 % bolus 1,000 mL (0 mLs Intravenous Stopped 04/16/20 2346)  diphenhydrAMINE (BENADRYL) injection 25 mg (25 mg Intravenous Given 04/16/20 2145)  ondansetron (ZOFRAN) injection 4 mg (4 mg Intravenous Given 04/16/20 2146)  ketorolac (TORADOL) 30 MG/ML injection 30 mg (30 mg Intravenous Given 04/16/20 2146)  methylPREDNISolone sodium succinate (SOLU-MEDROL) 125 mg/2 mL injection 125 mg (125 mg Intravenous Given 04/16/20 2345)     ____________________________________________   INITIAL IMPRESSION / ASSESSMENT AND PLAN / ED COURSE  Pertinent labs & imaging results that were available during my care of the patient were reviewed by me and considered in my medical decision making (see chart for details).  Review of the [place] CSRS was performed in accordance of the [facility] prior to dispensing any controlled drugs.     Patient presented to the emergency department for evaluation of headache.  Vital signs and exam are reassuring. Patient was given IV Toradol, Benadryl, Zofran, Solu-Medrol in the emergency department and is noticing a difference. 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 Patient will be discharged home with prescriptions for naproxen. Patient is to follow up with primary care neurology as directed. Patient is given ED precautions to return  to the ED for any worsening or new symptoms.   Ellen Richardson was evaluated in Emergency Department on 04/17/2020 for the symptoms described in the history of present illness. She was evaluated in the context of the global COVID-19 pandemic, which necessitated consideration that the patient might be at risk for infection with the SARS-CoV-2 virus that causes COVID-19. Institutional protocols and algorithms that pertain to the evaluation of patients at risk for COVID-19 are in a state of rapid change based on information released by regulatory bodies including the CDC and federal and state organizations. These policies and algorithms were followed during the patient's care in the ED.  ____________________________________________  FINAL CLINICAL IMPRESSION(S) / ED DIAGNOSES  Final diagnoses:  Other migraine without status migrainosus, not intractable      NEW MEDICATIONS STARTED DURING THIS VISIT:  ED Discharge Orders         Ordered    naproxen (NAPROSYN) 500 MG tablet  2 times daily with meals,   Status:  Discontinued        04/17/20 0002    naproxen (NAPROSYN) 500 MG tablet  2 times daily with meals        04/17/20 0022              This chart was dictated using voice recognition software/Dragon. Despite best efforts to proofread, errors can occur which can change the meaning. Any change was purely unintentional.    Enid Derry, PA-C 04/17/20 2052    Minna Antis, MD 04/17/20 2150

## 2020-04-16 NOTE — ED Triage Notes (Signed)
Patient reports having a headache for a couple weeks.  States can take otc and it will "calm" down but never totally goes away.

## 2020-04-16 NOTE — ED Notes (Signed)
Patient in stretcher. C/O headache x2 weeks. This RN explained plan of care to patient. Patient states understanding. Registration bedside.  NAD noted, call bell within reach.

## 2020-04-17 MED ORDER — NAPROXEN 500 MG PO TABS: 500.0000 mg | ORAL_TABLET | Freq: Two times a day (BID) | ORAL | 0 refills | Status: DC

## 2020-06-04 IMAGING — RF DG FLUORO GUIDE NDL PLC/BX
2 series · 4 of 4 positions shown · non-contrast
Comparison: none

CLINICAL DATA: Acute pain right shoulder.

[Series 1: cp_standard · 0.17mm/px · 1 of 1 slices shown (1 of 2)]
[im 1/1]
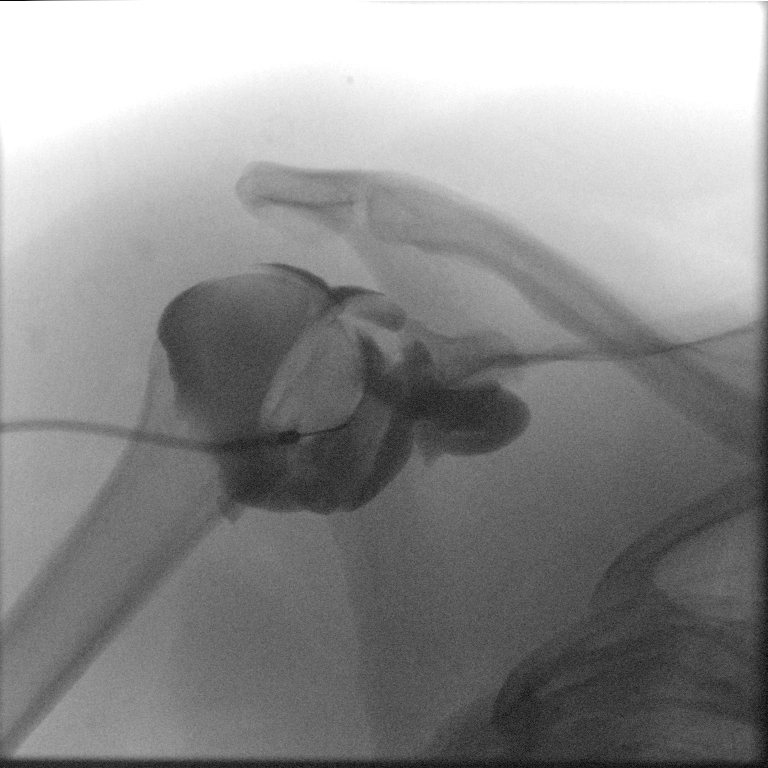

[Series 2: cp_standard · 0.17mm/px · 3 of 9 frames shown (2 of 2)]
[frame 2/9]
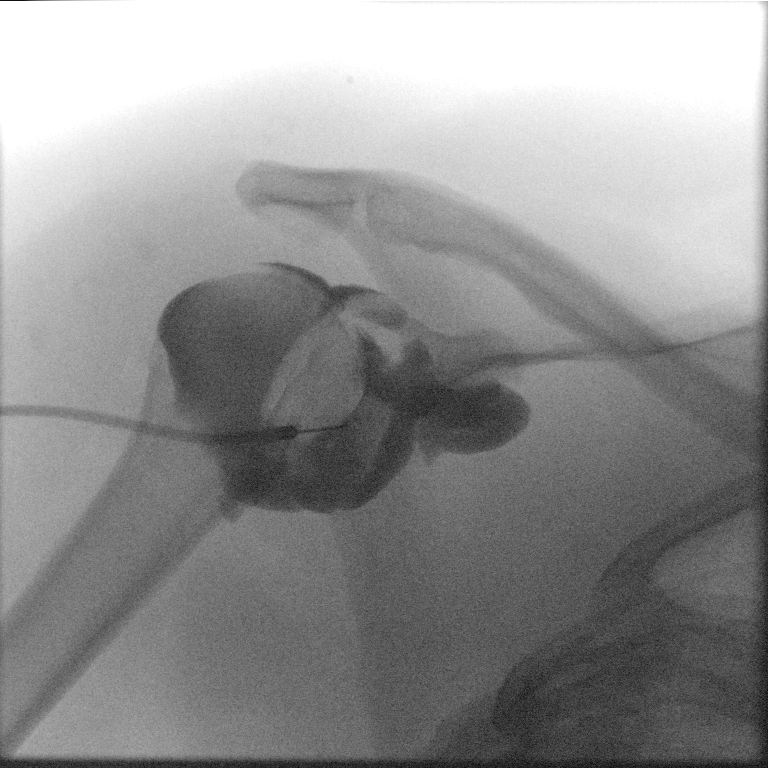
[frame 5/9]
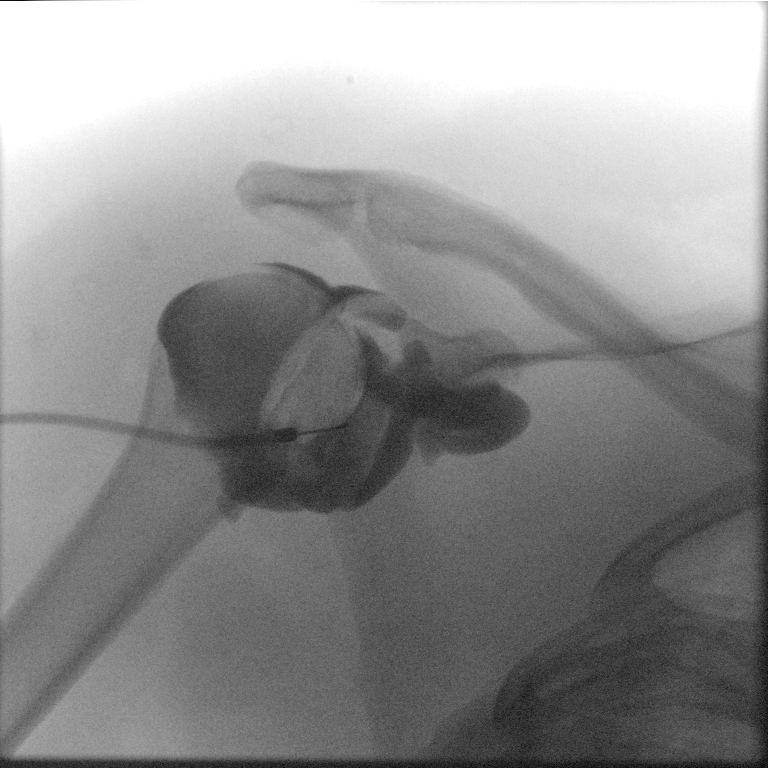
[frame 8/9]
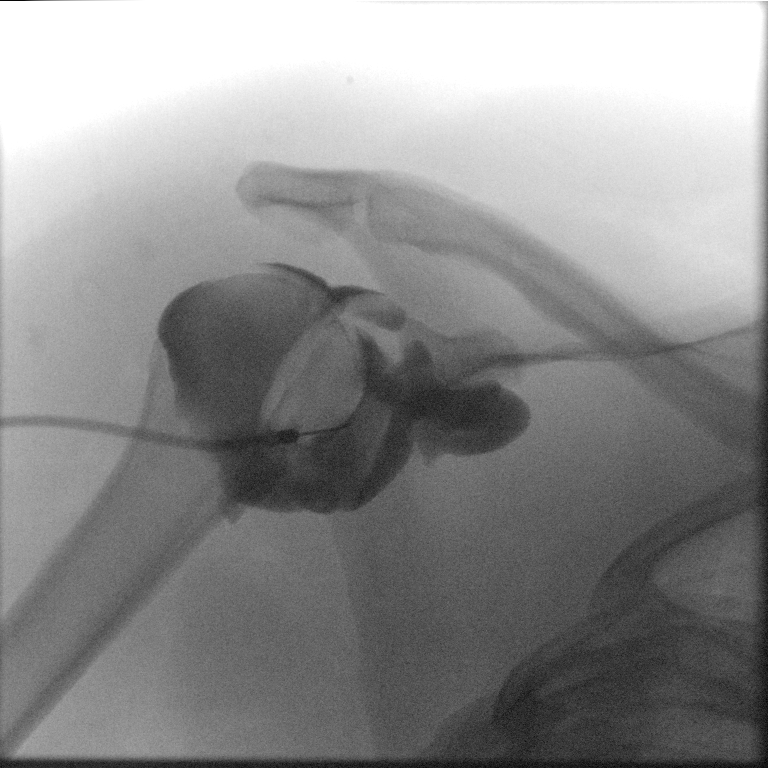

[4 of 4 positions shown; findings below may reference images not displayed]

EXAM:
RIGHT SHOULDER INJECTION UNDER FLUOROSCOPY

FLUOROSCOPY TIME:  Fluoroscopy Time: 0 minutes 48 seconds

Radiation Exposure Index (if provided by the fluoroscopic device):
13.9 mGy

PROCEDURE:
After discussing the risks and benefits of this procedure the
patient informed consent was obtained. Right shoulder sterilely
prepped and draped. Local anesthesia administered 1% lidocaine. 22
gauge needle was advanced into the right shoulder and standardized
mixture of nonionic contrast, sterile saline, and gadolinium
administered. There were no complications. Patient sent to MRI in
stable condition. Postprocedural care discussed with the patient.
IMPRESSION: Successful right shoulder injection for MRI arthrogram.

## 2020-07-26 ENCOUNTER — Encounter: Payer: Self-pay | Admitting: Hospice and Palliative Medicine

## 2020-07-26 ENCOUNTER — Ambulatory Visit (INDEPENDENT_AMBULATORY_CARE_PROVIDER_SITE_OTHER): Payer: 59 | Admitting: Physician Assistant

## 2020-07-26 ENCOUNTER — Other Ambulatory Visit: Payer: Self-pay

## 2020-07-26 DIAGNOSIS — R030 Elevated blood-pressure reading, without diagnosis of hypertension: Secondary | ICD-10-CM

## 2020-07-26 DIAGNOSIS — G43009 Migraine without aura, not intractable, without status migrainosus: Secondary | ICD-10-CM

## 2020-07-26 DIAGNOSIS — Z6841 Body Mass Index (BMI) 40.0 and over, adult: Secondary | ICD-10-CM | POA: Diagnosis not present

## 2020-07-26 MED ORDER — SUMATRIPTAN SUCCINATE 50 MG PO TABS: 50.0000 mg | ORAL_TABLET | ORAL | 0 refills | Status: DC | PRN

## 2020-07-26 MED ORDER — PROPRANOLOL HCL ER 80 MG PO CP24: 80.0000 mg | ORAL_CAPSULE | Freq: Every day | ORAL | 2 refills | Status: DC

## 2020-07-26 NOTE — Progress Notes (Signed)
[facility] [address] [place], [place] [address]  Internal MEDICINE  Office Visit Note  Patient Name: Ellen Richardson  [id number]  [id number]  Date of Service: 07/26/2020  Chief Complaint  Patient presents with  . Acute Visit    Headache started last week Wednesday, it will fade slightly but then it will be full strength again  . Quality Metric Gaps    Has COVID vaccine, pap       HPI Pt is here for a sick visit. -She has been experiencing her current headache since last Wednesday. She has tried ibuprofen and tylenol and they help initially but it comes right back and never fully goes away. Located in front, primarily behind Left eye. She has a history of migraines and she went to eye doctor in December and does have glasses, but this did not change her migraines. She will sometimes gets nauseous and will sometimes throw up during these episodes. Also sensitive to light. Will try to lie down and this sometimes helps and other times doesn't. Has had migraines for the last 1.5 years. Her sister also has migraines. -She snores sometimes if really tired, but not on a regular basis and does not wake gasping and has never been told she stops breathing. -Emgality trial for several months last year, but it did not help. She has never tried any other migraine medications.  -She had a migraine in december that sent her to the ED and had a noncontrast CT of head on 04/16/20 which was normal. -she has never been seen by neurology  Current Medication:  Outpatient Encounter Medications as of 07/26/2020  Medication Sig  . propranolol ER (INDERAL LA) 80 MG 24 hr capsule Take 1 capsule (80 mg total) by mouth daily.  . SUMAtriptan (IMITREX) 50 MG tablet Take 1 tablet (50 mg total) by mouth every 2 (two) hours as needed for migraine. May repeat in 2 hours if headache persists or recurs.  . [DISCONTINUED] famotidine (PEPCID) 20 MG tablet Take 1 tablet (20 mg total) by mouth 2 (two) times  daily for 15 days.  . [DISCONTINUED] ibuprofen (ADVIL) 800 MG tablet ibuprofen 800 mg tablet  Take 1 tablet 3 times a day by oral route as needed. (Patient not taking: Reported on 07/26/2020)  . [DISCONTINUED] naproxen (NAPROSYN) 500 MG tablet Take 1 tablet (500 mg total) by mouth 2 (two) times daily with a meal. (Patient not taking: Reported on 07/26/2020)  . [DISCONTINUED] nitrofurantoin, macrocrystal-monohydrate, (MACROBID) 100 MG capsule Take 1 capsule (100 mg total) by mouth 2 (two) times daily. (Patient not taking: Reported on 07/26/2020)  . [DISCONTINUED] Vitamin D, Ergocalciferol, (DRISDOL) 1.25 MG (50000 UNIT) CAPS capsule Take 1 capsule (50,000 Units total) by mouth every 7 (seven) days. (Patient not taking: Reported on 07/26/2020)   No facility-administered encounter medications on file as of 07/26/2020.      Medical History: Past Medical History:  Diagnosis Date  . Anemia 2008   h/o  . UTI (urinary tract infection)      Vital Signs: BP (!) 146/86   Pulse 88   Temp (!) 97.4 F (36.3 C)   Resp 16   Ht 4\' 7"  (1.397 m)   Wt 237 lb 3.2 oz (107.6 kg)   SpO2 99%   BMI 55.13 kg/m    Review of Systems  Constitutional: Negative for fatigue and fever.  HENT: Negative for congestion, mouth sores and postnasal drip.   Eyes: Positive for photophobia. Negative for visual disturbance.  Respiratory: Negative for cough.   Cardiovascular: Negative for chest pain.  Gastrointestinal: Positive for nausea.  Genitourinary: Negative for flank pain.  Neurological: Positive for headaches.  Psychiatric/Behavioral: Negative.     Physical Exam Vitals and nursing note reviewed.  Constitutional:      General: She is not in acute distress.    Appearance: She is well-developed. She is obese. She is not diaphoretic.  HENT:     Head: Normocephalic and atraumatic.     Mouth/Throat:     Pharynx: No oropharyngeal exudate.  Eyes:     Pupils: Pupils are equal, round, and reactive to light.   Neck:     Thyroid: No thyromegaly.     Vascular: No JVD.     Trachea: No tracheal deviation.  Cardiovascular:     Rate and Rhythm: Normal rate and regular rhythm.     Heart sounds: Normal heart sounds. No murmur heard. No friction rub. No gallop.   Pulmonary:     Effort: Pulmonary effort is normal. No respiratory distress.     Breath sounds: No wheezing or rales.  Chest:     Chest wall: No tenderness.  Abdominal:     General: Bowel sounds are normal.     Palpations: Abdomen is soft.  Musculoskeletal:        General: Normal range of motion.     Cervical back: Normal range of motion and neck supple.  Lymphadenopathy:     Cervical: No cervical adenopathy.  Skin:    General: Skin is warm and dry.  Neurological:     Mental Status: She is alert and oriented to person, place, and time.     Cranial Nerves: No cranial nerve deficit.     Motor: No weakness.     Gait: Gait normal.  Psychiatric:        Behavior: Behavior normal.        Thought Content: Thought content normal.        Judgment: Judgment normal.       Assessment/Plan: 1. Migraine without aura and without status migrainosus, not intractable Will start on 80mg  propranolol daily for migraine prevention as well as BP control. May need to increase dose in future. Pt may also take sumatriptan for acute onset of migraines. Discussed that this medication should only be taken as needed and is not a daily medication like the propranolol. She should not take more than 2 tabs in 24 hours. Pt expressed understanding. Will re-evaluate in 4 weeks. May need to consider sleep study or neurology consult if no improvement. CT in December was negative. Might need suppressive therapy in addition to abortive until BP is well controlled  - propranolol ER (INDERAL LA) 80 MG 24 hr capsule; Take 1 capsule (80 mg total) by mouth daily.  Dispense: 30 capsule; Refill: 2 - SUMAtriptan (IMITREX) 50 MG tablet; Take 1 tablet (50 mg total) by mouth every  2 (two) hours as needed for migraine. May repeat in 2 hours if headache persists or recurs.  Dispense: 10 tablet; Refill: 0  2. Elevated BP without diagnosis of hypertension Will start on propranolol for migraine prevention as well as BP control. Continue to monitor.  3. BMI 50.0-59.9, adult (HCC) Will consider sleep study in future if increased daytime sleepiness, or other signs of OSA--currently not snoring regularly and no gasping, but rising BP, migraines and BMI may be indication for study. Obesity Counseling: Had a lengthy discussion regarding patients BMI and weight issues. Patient was instructed on portion  control as well as increased activity. Also discussed caloric restrictions with trying to maintain intake less than 2000 Kcal. Discussions were made in accordance with the 5As of weight management. Simple actions such as not eating late and if able to, taking a walk is suggested.   General Counseling: Ahlayah verbalizes understanding of the findings of todays visit and agrees with plan of treatment. I have discussed any further diagnostic evaluation that may be needed or ordered today. We also reviewed her medications today. she has been encouraged to call the office with any questions or concerns that should arise related to todays visit.    Counseling:  Hypertension Counseling:   The following hypertensive lifestyle modification were recommended and discussed:  1. Limiting alcohol intake to less than 1 oz/day of ethanol:(24 oz of beer or 8 oz of wine or 2 oz of 100-proof whiskey). 2. Take baby ASA 81 mg daily. 3. Importance of regular aerobic exercise and losing weight. 4. Reduce dietary saturated fat and cholesterol intake for overall cardiovascular health. 5. Maintaining adequate dietary potassium, calcium, and magnesium intake. 6. Regular monitoring of the blood pressure. 7. Reduce sodium intake to less than 100 mmol/day (less than 2.3 gm of sodium or less than 6 gm of sodium  choride)   No orders of the defined types were placed in this encounter.   Meds ordered this encounter  Medications  . propranolol ER (INDERAL LA) 80 MG 24 hr capsule    Sig: Take 1 capsule (80 mg total) by mouth daily.    Dispense:  30 capsule    Refill:  2  . SUMAtriptan (IMITREX) 50 MG tablet    Sig: Take 1 tablet (50 mg total) by mouth every 2 (two) hours as needed for migraine. May repeat in 2 hours if headache persists or recurs.    Dispense:  10 tablet    Refill:  0    Time spent:30 Minutes

## 2020-08-15 ENCOUNTER — Encounter: Payer: Self-pay | Admitting: Hospice and Palliative Medicine

## 2020-08-15 ENCOUNTER — Ambulatory Visit (INDEPENDENT_AMBULATORY_CARE_PROVIDER_SITE_OTHER): Payer: 59 | Admitting: Hospice and Palliative Medicine

## 2020-08-15 ENCOUNTER — Other Ambulatory Visit: Payer: Self-pay

## 2020-08-15 VITALS — BP 148/90 | HR 70 | Temp 97.3°F | Resp 16 | Ht 60.0 in | Wt 239.8 lb

## 2020-08-15 DIAGNOSIS — G43009 Migraine without aura, not intractable, without status migrainosus: Secondary | ICD-10-CM

## 2020-08-15 DIAGNOSIS — R5383 Other fatigue: Secondary | ICD-10-CM | POA: Diagnosis not present

## 2020-08-15 DIAGNOSIS — I10 Essential (primary) hypertension: Secondary | ICD-10-CM

## 2020-08-15 DIAGNOSIS — Z0001 Encounter for general adult medical examination with abnormal findings: Secondary | ICD-10-CM | POA: Diagnosis not present

## 2020-08-15 DIAGNOSIS — R300 Dysuria: Secondary | ICD-10-CM

## 2020-08-15 DIAGNOSIS — S40861A Insect bite (nonvenomous) of right upper arm, initial encounter: Secondary | ICD-10-CM

## 2020-08-15 DIAGNOSIS — W57XXXA Bitten or stung by nonvenomous insect and other nonvenomous arthropods, initial encounter: Secondary | ICD-10-CM

## 2020-08-15 MED ORDER — AMLODIPINE BESYLATE 2.5 MG PO TABS: 2.5000 mg | ORAL_TABLET | Freq: Every day | ORAL | 0 refills | Status: DC

## 2020-08-15 MED ORDER — FLUCONAZOLE 150 MG PO TABS: ORAL_TABLET | ORAL | 0 refills | Status: DC

## 2020-08-15 MED ORDER — DOXYCYCLINE HYCLATE 100 MG PO TABS
100.0000 mg | ORAL_TABLET | Freq: Two times a day (BID) | ORAL | 0 refills | Status: DC
Start: 2020-08-15 — End: 2020-09-01

## 2020-08-15 NOTE — Progress Notes (Signed)
[facility] [address] [place], [place] [address]  Internal MEDICINE  Office Visit Note  Patient Name: Ellen Richardson  [id number]  [id number]  Date of Service: 08/19/2020  Chief Complaint  Patient presents with  . Annual Exam    Discuss BP, headaches, weight loss, irregular periods, face breaking out around chin possibly from mask, pt uses new mask everyday, may be from hormones   . Anemia  . Quality Metric Gaps    Has had covid vaccine will bring card next time booster 05/13/2020, pap done with GYN is due     HPI Pt is here for routine health maintenance examination GYN responsible for PAP smears--up to date  Having frequent headaches--in the last 30 days has had about 6-7 headache days, was started on Propranolol for prophylaxis at last visit and given Imitrex for treatment of acute migraines Has been taking Propranolol--has not noticed a significant difference Has not yet tried Imitrex for treatment Normal head CT in December due to frequent headaches at that time  BP remains elevated today  Concerned about facial acne--mostly on her chin and jaw line, feels this is related to mask wearing Has been cleaning her face with alcohol daily to help with breakouts--does not use a daily face wash or moisturizer  Noticed tick bite on right under arm a few days ago, tick has been completely removed but bite remains red and itchy  Current Medication: Outpatient Encounter Medications as of 08/15/2020  Medication Sig  . amLODipine (NORVASC) 2.5 MG tablet Take 1 tablet (2.5 mg total) by mouth daily.  Marland Kitchen doxycycline (VIBRA-TABS) 100 MG tablet Take 1 tablet (100 mg total) by mouth 2 (two) times daily.  . fluconazole (DIFLUCAN) 150 MG tablet Take one tablet by mouth for one dose, may repeat dosing in 3 days for recurring symptoms, may repeat for two additional doses.  Marland Kitchen propranolol ER (INDERAL LA) 80 MG 24 hr capsule Take 1 capsule (80 mg total) by mouth daily.  . SUMAtriptan  (IMITREX) 50 MG tablet Take 1 tablet (50 mg total) by mouth every 2 (two) hours as needed for migraine. May repeat in 2 hours if headache persists or recurs.   No facility-administered encounter medications on file as of 08/15/2020.    Surgical History: Past Surgical History:  Procedure Laterality Date  . IUD REMOVAL  2018  . SHOULDER ARTHROSCOPY WITH SUBACROMIAL DECOMPRESSION Right 04/26/2017   Procedure: SHOULDER ARTHROSCOPY WITH SUBACROMIAL DECOMPRESSION;  Surgeon: Juanell Fairly, MD;  Location: [facility] ORS;  Service: Orthopedics;  Laterality: Right;  . SHOULDER SURGERY      Medical History: Past Medical History:  Diagnosis Date  . Anemia 2008   h/o  . UTI (urinary tract infection)     Family History: Family History  Problem Relation Age of Onset  . Hypertension Father   . Diabetes Father       Review of Systems  Constitutional: Negative for chills, diaphoresis and fatigue.  HENT: Negative for ear pain, postnasal drip and sinus pressure.   Eyes: Negative for photophobia, discharge, redness, itching and visual disturbance.  Respiratory: Negative for cough, shortness of breath and wheezing.   Cardiovascular: Negative for chest pain, palpitations and leg swelling.  Gastrointestinal: Negative for abdominal pain, constipation, diarrhea, nausea and vomiting.  Genitourinary: Negative for dysuria and flank pain.  Musculoskeletal: Negative for arthralgias, back pain, gait problem and neck pain.  Skin: Negative for color change.       Right under arm tick bite  Acne  Allergic/Immunologic: Negative for environmental allergies and food allergies.  Neurological: Positive for headaches. Negative for dizziness.  Hematological: Does not bruise/bleed easily.  Psychiatric/Behavioral: Negative for agitation, behavioral problems (depression) and hallucinations.     Vital Signs: BP (!) 148/90   Pulse 70   Temp (!) 97.3 F (36.3 C)   Resp 16   Ht 5' (1.524 m)   Wt 239 lb 12.8 oz  (108.8 kg)   SpO2 99%   BMI 46.83 kg/m    Physical Exam Vitals reviewed.  Constitutional:      Appearance: Normal appearance. She is normal weight.  Cardiovascular:     Rate and Rhythm: Normal rate and regular rhythm.     Pulses: Normal pulses.     Heart sounds: Normal heart sounds.  Pulmonary:     Effort: Pulmonary effort is normal.     Breath sounds: Normal breath sounds.  Abdominal:     General: Abdomen is flat.     Palpations: Abdomen is soft.  Musculoskeletal:        General: Normal range of motion.     Cervical back: Normal range of motion.  Skin:    General: Skin is warm.     Comments: Open and close comedones--chin, jaw line  Insect bite to under arm right sided--no evidence of tick present  Neurological:     General: No focal deficit present.     Mental Status: She is alert and oriented to person, place, and time. Mental status is at baseline.  Psychiatric:        Mood and Affect: Mood normal.        Behavior: Behavior normal.        Thought Content: Thought content normal.        Judgment: Judgment normal.      LABS: Recent Results (from the past 2160 hour(s))  UA/M w/rflx Culture, Routine     Status: None   Collection Time: 08/15/20 10:03 AM   Specimen: Urine   Urine  Result Value Ref Range   Specific Gravity, UA 1.024 1.005 - 1.030   pH, UA 7.5 5.0 - 7.5   Color, UA Yellow Yellow   Appearance Ur Clear Clear   Leukocytes,UA Negative Negative   Protein,UA Trace Negative/Trace   Glucose, UA Negative Negative   Ketones, UA Negative Negative   RBC, UA Negative Negative   Bilirubin, UA Negative Negative   Urobilinogen, Ur 1.0 0.2 - 1.0 mg/dL   Nitrite, UA Negative Negative   Microscopic Examination Comment     Comment: Microscopic follows if indicated.   Microscopic Examination See below:     Comment: Microscopic was indicated and was performed.   Urinalysis Reflex Comment     Comment: This specimen will not reflex to a Urine Culture.   Microscopic Examination     Status: None   Collection Time: 08/15/20 10:03 AM   Urine  Result Value Ref Range   WBC, UA 0-5 0 - 5 /hpf   RBC None seen 0 - 2 /hpf   Epithelial Cells (non renal) 0-10 0 - 10 /hpf   Casts None seen None seen /lpf   Bacteria, UA None seen None seen/Few    Assessment/Plan: 1. Encounter for routine adult health examination with abnormal findings Well appearing 35 year old female Up to date on age appropriate PHM Will update labs and adjust plan of care accordingly  2. Essential hypertension Start low dose amlodipine for elevated BP documented on multiple office visits, close follow-up  to monitor BP response to therapy - amLODipine (NORVASC) 2.5 MG tablet; Take 1 tablet (2.5 mg total) by mouth daily.  Dispense: 90 tablet; Refill: 0  3. Migraine without aura and without status migrainosus, not intractable Continue with propranolol Will work on controlling BP which may be contributing to headaches Encouraged to trial Imitrex for migraine treatment  4. Morbid obesity (HCC) BMI 46 Obesity Counseling: Risk Assessment: An assessment of behavioral risk factors was made today and includes lack of exercise sedentary lifestyle, lack of portion control and poor dietary habits.  Risk Modification Advice: She was counseled on portion control guidelines. Restricting daily caloric intake to 1800. The detrimental long term effects of obesity on her health and ongoing poor compliance was also discussed with the patient.  5. Tick bite of right upper arm, initial encounter Prophylactic treatment with course of doxycycline Fluconazole given due to history of vaginal candidiasis with antibiotic use - doxycycline (VIBRA-TABS) 100 MG tablet; Take 1 tablet (100 mg total) by mouth 2 (two) times daily.  Dispense: 20 tablet; Refill: 0 - fluconazole (DIFLUCAN) 150 MG tablet; Take one tablet by mouth for one dose, may repeat dosing in 3 days for recurring symptoms, may repeat for  two additional doses.  Dispense: 3 tablet; Refill: 0  6. Other fatigue - CBC w/Diff/Platelet - Comprehensive Metabolic Panel (CMET) - Lipid Panel With LDL/HDL Ratio - TSH + free T4 - Vitamin D (25 hydroxy) - B12  7. Dysuria - UA/M w/rflx Culture, Routine - Microscopic Examination  General Counseling: Ellen Richardson verbalizes understanding of the findings of todays visit and agrees with plan of treatment. I have discussed any further diagnostic evaluation that may be needed or ordered today. We also reviewed her medications today. she has been encouraged to call the office with any questions or concerns that should arise related to todays visit.    Counseling: Hypertension Counseling:   The following hypertensive lifestyle modification were recommended and discussed:  1. Limiting alcohol intake to less than 1 oz/day of ethanol:(24 oz of beer or 8 oz of wine or 2 oz of 100-proof whiskey). 2. Take baby ASA 81 mg daily. 3. Importance of regular aerobic exercise and losing weight. 4. Reduce dietary saturated fat and cholesterol intake for overall cardiovascular health. 5. Maintaining adequate dietary potassium, calcium, and magnesium intake. 6. Regular monitoring of the blood pressure. 7. Reduce sodium intake to less than 100 mmol/day (less than 2.3 gm of sodium or less than 6 gm of sodium choride)    Orders Placed This Encounter  Procedures  . Microscopic Examination  . UA/M w/rflx Culture, Routine  . CBC w/Diff/Platelet  . Comprehensive Metabolic Panel (CMET)  . Lipid Panel With LDL/HDL Ratio  . TSH + free T4  . Vitamin D (25 hydroxy)  . B12    Meds ordered this encounter  Medications  . amLODipine (NORVASC) 2.5 MG tablet    Sig: Take 1 tablet (2.5 mg total) by mouth daily.    Dispense:  90 tablet    Refill:  0  . doxycycline (VIBRA-TABS) 100 MG tablet    Sig: Take 1 tablet (100 mg total) by mouth 2 (two) times daily.    Dispense:  20 tablet    Refill:  0  . fluconazole  (DIFLUCAN) 150 MG tablet    Sig: Take one tablet by mouth for one dose, may repeat dosing in 3 days for recurring symptoms, may repeat for two additional doses.    Dispense:  3 tablet  Refill:  0    Total time spent: 30 Minutes  Time spent includes review of chart, medications, test results, and follow up plan with the patient.   This patient was seen by Theodoro Grist AGNP-C Collaboration with Dr Lavera Guise as a part of collaborative care agreement   Tanna Furry. [facility] Internal Medicine

## 2020-08-16 LAB — MICROSCOPIC EXAMINATION
Bacteria, UA: NONE SEEN
Casts: NONE SEEN /lpf
RBC, Urine: NONE SEEN /hpf (ref 0–2)

## 2020-08-16 LAB — UA/M W/RFLX CULTURE, ROUTINE
Bilirubin, UA: NEGATIVE
Glucose, UA: NEGATIVE
Ketones, UA: NEGATIVE
Leukocytes,UA: NEGATIVE
Nitrite, UA: NEGATIVE
RBC, UA: NEGATIVE
Specific Gravity, UA: 1.024 (ref 1.005–1.030)
Urobilinogen, Ur: 1 mg/dL (ref 0.2–1.0)
pH, UA: 7.5 (ref 5.0–7.5)

## 2020-08-19 ENCOUNTER — Encounter: Payer: Self-pay | Admitting: Hospice and Palliative Medicine

## 2020-08-29 ENCOUNTER — Ambulatory Visit: Payer: Self-pay | Admitting: Physician Assistant

## 2020-09-01 ENCOUNTER — Ambulatory Visit: Payer: 59 | Admitting: Physician Assistant

## 2020-09-01 ENCOUNTER — Other Ambulatory Visit: Payer: Self-pay

## 2020-09-01 ENCOUNTER — Encounter: Payer: Self-pay | Admitting: Physician Assistant

## 2020-09-01 DIAGNOSIS — I10 Essential (primary) hypertension: Secondary | ICD-10-CM

## 2020-09-01 DIAGNOSIS — Z0271 Encounter for disability determination: Secondary | ICD-10-CM

## 2020-09-01 DIAGNOSIS — Z9229 Personal history of other drug therapy: Secondary | ICD-10-CM

## 2020-09-01 DIAGNOSIS — G43009 Migraine without aura, not intractable, without status migrainosus: Secondary | ICD-10-CM | POA: Diagnosis not present

## 2020-09-01 DIAGNOSIS — G4710 Hypersomnia, unspecified: Secondary | ICD-10-CM

## 2020-09-01 DIAGNOSIS — Z6841 Body Mass Index (BMI) 40.0 and over, adult: Secondary | ICD-10-CM | POA: Diagnosis not present

## 2020-09-01 NOTE — Progress Notes (Signed)
[facility] [address] [place], [place] [address]  Internal MEDICINE  Office Visit Note  Patient Name: Ellen Richardson  [id number]  [id number]  Date of Service: 09/01/2020  Chief Complaint  Patient presents with  . Follow-up    Discuss short term disability for migraines, bleeding for a month maybe longer, changing products every 2 hours, this happens at least twice a year, sever cramps when starting her cycle  . Anemia  . Quality Metric Gaps    Covid booster done    HPI Pt is here for follow up visit to discuss short term disability paperwork due to migraines. -Left work last Wednesday at 10am for migraine and has not been able to go back this week. Some mornings wakes up with migraine and never feels it coming on. Migraines can last a few days and then decrease to a headache and will go away for maybe 2 days and then a new migraine starts. When migraines hit peak her eyes water and she tries to take a bath or lay down. She is affected by lights and sounds, goes to room and puts blanket over window to block light and will lay down. Sometimes can sleep and it gets better, but other times it is still present. She had a negative CT of the head back in December for this. -Sister has migraines and she had a tumor on pituitary gland--this was removed as a teen. -Pt does snore at times, no startling awake, not sure if she stops breathing. Pt has no reflux. No trouble concentrating or memory problems. She has gained 15lbs in the past year and is tired during the daytime. Based on snoring, BMI, and migraines would benefit from sleep study especially with ESS of 12. -She is going back to work after Monday and been out since last wed and to secure her job needs disability paperwork for this time period. Discussed that this is not something we do regularly and that she needs to take her medication as prescribed and be seen by neurology for further evaluation. She will be faxing [place] paperwork  for [place] to determine if appropriate based on request. -[employer] for work. -At work BP 140s--suggest double amlodipine to 5mg  and continue to monitor  EPWORTH SLEEPINESS SCALE:  Scale:  (0)= no chance of dozing; (1)= slight chance of dozing; (2)= moderate chance of dozing; (3)= high chance of dozing  Chance  Situtation    Sitting and reading: 3    Watching TV: 3    Sitting Inactive in public: 0    As a passenger in car: 3      Lying down to rest: 3    Sitting and talking: 0    Sitting quielty after lunch: 0    In a car, stopped in traffic: 0   TOTAL SCORE:   12 out of 24   Current Medication: Outpatient Encounter Medications as of 09/01/2020  Medication Sig  . amLODipine (NORVASC) 2.5 MG tablet Take 1 tablet (2.5 mg total) by mouth daily.  . propranolol ER (INDERAL LA) 80 MG 24 hr capsule Take 1 capsule (80 mg total) by mouth daily.  . SUMAtriptan (IMITREX) 50 MG tablet Take 1 tablet (50 mg total) by mouth every 2 (two) hours as needed for migraine. May repeat in 2 hours if headache persists or recurs.  . [DISCONTINUED] doxycycline (VIBRA-TABS) 100 MG tablet Take 1 tablet (100 mg total) by mouth 2 (two) times daily.  . [DISCONTINUED] fluconazole (DIFLUCAN) 150  MG tablet Take one tablet by mouth for one dose, may repeat dosing in 3 days for recurring symptoms, may repeat for two additional doses.   No facility-administered encounter medications on file as of 09/01/2020.    Surgical History: Past Surgical History:  Procedure Laterality Date  . IUD REMOVAL  2018  . SHOULDER ARTHROSCOPY WITH SUBACROMIAL DECOMPRESSION Right 04/26/2017   Procedure: SHOULDER ARTHROSCOPY WITH SUBACROMIAL DECOMPRESSION;  Surgeon: Juanell Fairly, MD;  Location: [facility] ORS;  Service: Orthopedics;  Laterality: Right;  . SHOULDER SURGERY      Medical History: Past Medical History:  Diagnosis Date  . Anemia 2008   h/o  . UTI (urinary tract infection)     Family  History: Family History  Problem Relation Age of Onset  . Hypertension Father   . Diabetes Father     Social History   Socioeconomic History  . Marital status: Married    Spouse name: Not on file  . Number of children: Not on file  . Years of education: Not on file  . Highest education level: Not on file  Occupational History  . Not on file  Tobacco Use  . Smoking status: Never Smoker  . Smokeless tobacco: Never Used  Vaping Use  . Vaping Use: Never used  Substance and Sexual Activity  . Alcohol use: Yes    Comment: occ  . Drug use: No  . Sexual activity: Not on file  Other Topics Concern  . Not on file  Social History Narrative   ** Merged History Encounter **       Social Determinants of Health   Financial Resource Strain: Not on file  Food Insecurity: Not on file  Transportation Needs: Not on file  Physical Activity: Not on file  Stress: Not on file  Social Connections: Not on file  Intimate Partner Violence: Not on file      Review of Systems  Constitutional: Positive for fatigue and unexpected weight change. Negative for chills.  HENT: Negative for congestion, postnasal drip, rhinorrhea, sneezing and sore throat.   Eyes: Positive for photophobia. Negative for redness.  Respiratory: Negative for cough, chest tightness and shortness of breath.   Cardiovascular: Negative for chest pain and palpitations.  Gastrointestinal: Negative for abdominal pain, constipation, diarrhea, nausea and vomiting.  Genitourinary: Negative for dysuria and frequency.  Musculoskeletal: Negative for arthralgias, back pain, joint swelling and neck pain.  Skin: Negative for rash.  Neurological: Positive for headaches. Negative for tremors and numbness.  Hematological: Negative for adenopathy. Does not bruise/bleed easily.  Psychiatric/Behavioral: Positive for sleep disturbance. Negative for behavioral problems (Depression) and suicidal ideas. The patient is not nervous/anxious.      Vital Signs: BP (!) 138/94   Pulse 85   Temp (!) 97.4 F (36.3 C)   Resp 16   Ht 5' (1.524 m)   Wt 240 lb (108.9 kg)   SpO2 99%   BMI 46.87 kg/m    Physical Exam Vitals and nursing note reviewed.  Constitutional:      General: She is not in acute distress.    Appearance: She is well-developed. She is obese. She is not diaphoretic.  HENT:     Head: Normocephalic and atraumatic.     Mouth/Throat:     Pharynx: No oropharyngeal exudate.  Eyes:     Pupils: Pupils are equal, round, and reactive to light.  Neck:     Thyroid: No thyromegaly.     Vascular: No JVD.     Trachea: No  tracheal deviation.  Cardiovascular:     Rate and Rhythm: Normal rate and regular rhythm.     Heart sounds: Normal heart sounds. No murmur heard. No friction rub. No gallop.   Pulmonary:     Effort: Pulmonary effort is normal. No respiratory distress.     Breath sounds: No wheezing or rales.  Chest:     Chest wall: No tenderness.  Abdominal:     General: Bowel sounds are normal.     Palpations: Abdomen is soft.  Musculoskeletal:        General: Normal range of motion.     Cervical back: Normal range of motion and neck supple.  Lymphadenopathy:     Cervical: No cervical adenopathy.  Skin:    General: Skin is warm and dry.  Neurological:     Mental Status: She is alert and oriented to person, place, and time.     Cranial Nerves: No cranial nerve deficit.  Psychiatric:        Behavior: Behavior normal.        Thought Content: Thought content normal.        Judgment: Judgment normal.        Assessment/Plan: 1. Encounter for disability assessment Patient will be faxing paperwork for [place] to review and determine if appropriate due to severe migraine preventing her from working the last week.  2. Migraine without aura and without status migrainosus, not intractable Continue Propranolol for migraine prevention, educated to try imitrex for acute migraine relief but educated that this should  not be taken daily, and may use tylenol as needed. Will refer to neurology due to severity and new need for disability secondary to migraines. Will also order PSG. - Ambulatory referral to Neurology  3. Hypersomnia Based on snoring, HTN, BMI, migraines, and sleepiness pt would benefit from PSG for further evaluation. - PSG SLEEP STUDY  4. Essential hypertension Increase amlodipine to 5mg  and continue propranolol. Monitor BP closely.  5. Morbid obesity with BMI of 45.0-49.9, adult (HCC) Obesity Counseling: Had a lengthy discussion regarding patients BMI and weight issues. Patient was instructed on portion control as well as increased activity. Also discussed caloric restrictions with trying to maintain intake less than 2000 Kcal. Discussions were made in accordance with the 5As of weight management. Simple actions such as not eating late and if able to, taking a walk is suggested.     General Counseling: Lizann verbalizes understanding of the findings of todays visit and agrees with plan of treatment. I have discussed any further diagnostic evaluation that may be needed or ordered today. We also reviewed her medications today. she has been encouraged to call the office with any questions or concerns that should arise related to todays visit.    Orders Placed This Encounter  Procedures  . Ambulatory referral to Neurology  . PSG SLEEP STUDY    No orders of the defined types were placed in this encounter.   This patient was seen by 09-23-1983, PA-C in collaboration with Dr. Lynn Ito as a part of collaborative care agreement.   Total time spent:30 Minutes Time spent includes review of chart, medications, test results, and follow up plan with the patient.      Dr Beverely Risen Internal medicine

## 2020-09-06 ENCOUNTER — Telehealth: Payer: Self-pay

## 2020-09-06 NOTE — Telephone Encounter (Signed)
error 

## 2020-09-06 NOTE — Telephone Encounter (Signed)
If she wants to pick up a note from the front desk she can. I am not in the office until thursday though so we would need to ask Dr. Welton Flakes if she will sign off if she needs to pick them up prior to Thursday.

## 2020-09-07 NOTE — Telephone Encounter (Signed)
Per Dr. Beverely Risen pt is ok to return to work on 09-08-20

## 2020-09-08 DIAGNOSIS — Z2821 Immunization not carried out because of patient refusal: Secondary | ICD-10-CM | POA: Insufficient documentation

## 2020-09-12 ENCOUNTER — Ambulatory Visit: Payer: 59 | Admitting: Physician Assistant

## 2020-09-15 ENCOUNTER — Other Ambulatory Visit: Payer: Self-pay

## 2020-09-15 ENCOUNTER — Encounter: Payer: Self-pay | Admitting: Nurse Practitioner

## 2020-09-15 ENCOUNTER — Ambulatory Visit: Payer: 59 | Admitting: Internal Medicine

## 2020-09-15 VITALS — BP 138/96 | HR 90 | Temp 97.5°F | Resp 16 | Ht 60.0 in | Wt 240.8 lb

## 2020-09-15 DIAGNOSIS — G479 Sleep disorder, unspecified: Secondary | ICD-10-CM

## 2020-09-15 DIAGNOSIS — I10 Essential (primary) hypertension: Secondary | ICD-10-CM | POA: Diagnosis not present

## 2020-09-15 DIAGNOSIS — G43009 Migraine without aura, not intractable, without status migrainosus: Secondary | ICD-10-CM

## 2020-09-15 DIAGNOSIS — Z6841 Body Mass Index (BMI) 40.0 and over, adult: Secondary | ICD-10-CM | POA: Diagnosis not present

## 2020-09-15 NOTE — Progress Notes (Signed)
[facility] [address] [place], [place] [address]  Internal MEDICINE  Office Visit Note  Patient Name: Ellen Richardson  [id number]  [id number]  Date of Service: 09/15/2020  Chief Complaint  Patient presents with  . Follow-up    Short term disability paperwork  . Anemia    HPI Patient is here for short-term disability paperwork to be filled out. She was out from work with severe migraine headache from April 2017 to May 11 she went back to work on May 12th.  Patient has diagnosis of uncontrolled hypertension and she is on Norvasc 2.5 mg once a day and Inderal LA 80 mg once a day her blood pressure seems to be a little better today for abortive care patient is on Imitrex however this is not a good medication for her and we need to find an alternate therapy for her she is scheduled to have appointment with neurology Patient scored high on her Epworth scale and will be scheduled to have a sleep study Patient is also has problems with And will need help with weight management    Current Medication: Outpatient Encounter Medications as of 09/15/2020  Medication Sig  . amLODipine (NORVASC) 2.5 MG tablet Take 1 tablet (2.5 mg total) by mouth daily.  . propranolol ER (INDERAL LA) 80 MG 24 hr capsule Take 1 capsule (80 mg total) by mouth daily.  . SUMAtriptan (IMITREX) 50 MG tablet Take 1 tablet (50 mg total) by mouth every 2 (two) hours as needed for migraine. May repeat in 2 hours if headache persists or recurs.   No facility-administered encounter medications on file as of 09/15/2020.    Surgical History: Past Surgical History:  Procedure Laterality Date  . IUD REMOVAL  2018  . SHOULDER ARTHROSCOPY WITH SUBACROMIAL DECOMPRESSION Right 04/26/2017   Procedure: SHOULDER ARTHROSCOPY WITH SUBACROMIAL DECOMPRESSION;  Surgeon: Juanell Fairly, MD;  Location: [facility] ORS;  Service: Orthopedics;  Laterality: Right;  . SHOULDER SURGERY      Medical History: Past Medical History:   Diagnosis Date  . Anemia 2008   h/o  . UTI (urinary tract infection)     Family History: Family History  Problem Relation Age of Onset  . Hypertension Father   . Diabetes Father     Social History   Socioeconomic History  . Marital status: Married    Spouse name: Not on file  . Number of children: Not on file  . Years of education: Not on file  . Highest education level: Not on file  Occupational History  . Not on file  Tobacco Use  . Smoking status: Never Smoker  . Smokeless tobacco: Never Used  Vaping Use  . Vaping Use: Never used  Substance and Sexual Activity  . Alcohol use: Yes    Comment: occ  . Drug use: No  . Sexual activity: Not on file  Other Topics Concern  . Not on file  Social History Narrative   ** Merged History Encounter **       Social Determinants of Health   Financial Resource Strain: Not on file  Food Insecurity: Not on file  Transportation Needs: Not on file  Physical Activity: Not on file  Stress: Not on file  Social Connections: Not on file  Intimate Partner Violence: Not on file      Review of Systems  Constitutional: Negative for chills, diaphoresis and fatigue.  HENT: Negative for ear pain, postnasal drip and sinus pressure.   Eyes: Negative for photophobia, discharge, redness,  itching and visual disturbance.  Respiratory: Negative for cough, shortness of breath and wheezing.   Cardiovascular: Negative for chest pain, palpitations and leg swelling.  Gastrointestinal: Negative for abdominal pain, constipation, diarrhea, nausea and vomiting.  Genitourinary: Negative for dysuria and flank pain.  Musculoskeletal: Negative for arthralgias, back pain, gait problem and neck pain.  Skin: Negative for color change.  Allergic/Immunologic: Negative for environmental allergies and food allergies.  Neurological: Negative for dizziness and headaches.  Hematological: Does not bruise/bleed easily.  Psychiatric/Behavioral: Negative for  agitation, behavioral problems (depression) and hallucinations.    Vital Signs: BP (!) 138/96   Pulse 90   Temp (!) 97.5 F (36.4 C)   Resp 16   Ht 5' (1.524 m)   Wt 240 lb 12.8 oz (109.2 kg)   SpO2 99%   BMI 47.03 kg/m    Physical Exam Paperwork is filled out    Assessment/Plan: 1. Migraine without aura and without status migrainosus, not intractable Patient has severe and incapacitating migraine headaches and she will probably need something for prevention/maintenance therapy avoid Imitrex or any other triptan's due to hypertension.  Will discontinue modalities/therapies with her next visit   2. Essential hypertension Blood pressure is improving however not under optimal control, she is on double therapy at the moment will titrate as indicated  3. Sleep disturbances  Patient has sign and symptoms of OSA ( disturbed sleep, excessive fatigue during the day, uncontrolled bp, uncontrolled headaches  and abnormal BMI). Baseline sleep study is ordered to further look into this. Long term complications of OSA was addressed with the patient.PSG Sleep Study; Future   4. BMI 45.0-49.9, adult [facility]) Patient suffers from obesity we will assess and assist with weight loss    General Counseling: [name] verbalizes understanding of the findings of todays visit and agrees with plan of treatment. I have discussed any further diagnostic evaluation that may be needed or ordered today. We also reviewed her medications today. she has been encouraged to call the office with any questions or concerns that should arise related to todays visit.    Orders Placed This Encounter  Procedures  . PSG Sleep Study    Total time spent:30 Minutes Time spent includes review of chart, medications, test results, and follow up plan with the patient.   [place] Controlled Substance Database was reviewed by me.   Dr Lyndon Code Internal medicine

## 2020-09-16 ENCOUNTER — Ambulatory Visit: Payer: 59 | Admitting: Physician Assistant

## 2020-09-16 ENCOUNTER — Telehealth: Payer: Self-pay

## 2020-09-16 NOTE — Telephone Encounter (Signed)
Provider filled out short term disability paperwork, made a copy and placed in scan and handed original to the patient. Toni Amend

## 2020-09-20 ENCOUNTER — Ambulatory Visit: Payer: 59 | Admitting: Nurse Practitioner

## 2020-09-27 ENCOUNTER — Ambulatory Visit: Payer: 59 | Admitting: Internal Medicine

## 2020-09-27 ENCOUNTER — Telehealth: Payer: Self-pay

## 2020-09-29 ENCOUNTER — Ambulatory Visit: Payer: 59 | Admitting: Nurse Practitioner

## 2020-10-01 ENCOUNTER — Encounter (INDEPENDENT_AMBULATORY_CARE_PROVIDER_SITE_OTHER): Payer: 59 | Admitting: Internal Medicine

## 2020-10-01 DIAGNOSIS — G4719 Other hypersomnia: Secondary | ICD-10-CM

## 2020-10-02 ENCOUNTER — Encounter (INDEPENDENT_AMBULATORY_CARE_PROVIDER_SITE_OTHER): Payer: 59 | Admitting: Internal Medicine

## 2020-10-02 DIAGNOSIS — G4733 Obstructive sleep apnea (adult) (pediatric): Secondary | ICD-10-CM | POA: Diagnosis not present

## 2020-10-04 ENCOUNTER — Telehealth: Payer: Self-pay

## 2020-10-04 NOTE — Telephone Encounter (Signed)
We have this patient scheduled on Thursday September 29, 2020 for a PSG study. They could only get an auth for patient to do a home sleep study.

## 2020-10-04 NOTE — Telephone Encounter (Signed)
Sent message to patient to reschedule 09/29/20 missed appointment-Toni

## 2020-10-10 ENCOUNTER — Ambulatory Visit: Payer: 59 | Admitting: Nurse Practitioner

## 2020-10-10 ENCOUNTER — Other Ambulatory Visit: Payer: Self-pay

## 2020-10-10 ENCOUNTER — Encounter: Payer: Self-pay | Admitting: Nurse Practitioner

## 2020-10-10 VITALS — BP 146/96 | HR 98 | Temp 97.4°F | Resp 16 | Ht 60.0 in | Wt 238.4 lb

## 2020-10-10 DIAGNOSIS — Z6841 Body Mass Index (BMI) 40.0 and over, adult: Secondary | ICD-10-CM

## 2020-10-10 DIAGNOSIS — I10 Essential (primary) hypertension: Secondary | ICD-10-CM

## 2020-10-10 DIAGNOSIS — G4733 Obstructive sleep apnea (adult) (pediatric): Secondary | ICD-10-CM | POA: Diagnosis not present

## 2020-10-10 DIAGNOSIS — Z0189 Encounter for other specified special examinations: Secondary | ICD-10-CM

## 2020-10-10 DIAGNOSIS — E559 Vitamin D deficiency, unspecified: Secondary | ICD-10-CM

## 2020-10-10 DIAGNOSIS — R7301 Impaired fasting glucose: Secondary | ICD-10-CM

## 2020-10-10 LAB — POCT GLYCOSYLATED HEMOGLOBIN (HGB A1C): Hemoglobin A1C: 5.5 % (ref 4.0–5.6)

## 2020-10-10 MED ORDER — AMLODIPINE BESYLATE 10 MG PO TABS: 10.0000 mg | ORAL_TABLET | Freq: Every day | ORAL | 0 refills | Status: AC

## 2020-10-10 MED ORDER — RYBELSUS 3 MG PO TABS: 3.0000 mg | ORAL_TABLET | Freq: Every day | ORAL | 0 refills | Status: DC

## 2020-10-10 NOTE — Progress Notes (Signed)
[facility] [place], [place] [address]  Internal MEDICINE  Office Visit Note  Patient Name: Ellen Richardson  [id number]  [id number]  Date of Service: 10/16/2020  Chief Complaint  Patient presents with   Follow-up    Review labs, pt wants doctors note from the 12th and office visit notes, refills   Anemia    HPI Ellen Richardson presents for a follow-up visit to discuss lab results, refills and is requesting a doctor's note for April 27 to May 12.  She has a history of anemia and has had a UTI in the past.  Her surgical history is only positive for shoulder arthroscopy.  Due to a history of impaired fasting glucose and family history of diabetes, A1c was checked today but it was normal at 5.5. Patient has not had a labs drawn in over a year will order routine labs. Patient reports she did a sleep study at home and that her insurance would not pay for the sleep study in the office.  Will re[facility]  We will have insurance queried about coverage for sleep study.    Current Medication: Outpatient Encounter Medications as of 10/10/2020  Medication Sig   amLODipine (NORVASC) 10 MG tablet Take 1 tablet (10 mg total) by mouth daily.   propranolol ER (INDERAL LA) 80 MG 24 hr capsule Take 1 capsule (80 mg total) by mouth daily.   Semaglutide (RYBELSUS) 3 MG TABS Take 3 mg by mouth daily before breakfast.   SUMAtriptan (IMITREX) 50 MG tablet Take 1 tablet (50 mg total) by mouth every 2 (two) hours as needed for migraine. May repeat in 2 hours if headache persists or recurs.   [DISCONTINUED] amLODipine (NORVASC) 2.5 MG tablet Take 1 tablet (2.5 mg total) by mouth daily.   No facility-administered encounter medications on file as of 10/10/2020.    Surgical History: Past Surgical History:  Procedure Laterality Date   IUD REMOVAL  2018   SHOULDER ARTHROSCOPY WITH SUBACROMIAL DECOMPRESSION Right 04/26/2017   Procedure: SHOULDER  ARTHROSCOPY WITH SUBACROMIAL DECOMPRESSION;  Surgeon: Thornton Park, MD;  Location: [facility] ORS;  Service: Orthopedics;  Laterality: Right;   SHOULDER SURGERY      Medical History: Past Medical History:  Diagnosis Date   Anemia 2008   h/o   UTI (urinary tract infection)     Family History: Family History  Problem Relation Age of Onset   Hypertension Father    Diabetes Father     Social History   Socioeconomic History   Marital status: Married    Spouse name: Not on file   Number of children: Not on file   Years of education: Not on file   Highest education level: Not on file  Occupational History   Not on file  Tobacco Use   Smoking status: Never   Smokeless tobacco: Never  Vaping Use   Vaping Use: Never used  Substance and Sexual Activity   Alcohol use: Yes    Comment: occ   Drug use: No   Sexual activity: Not on file  Other Topics Concern   Not on file  Social History Narrative   ** Merged History Encounter **       Social Determinants of Health   Financial Resource Strain: Not on file  Food Insecurity: Not on file  Transportation Needs: Not on file  Physical Activity: Not on file  Stress: Not on file  Social Connections: Not on file  Intimate Partner Violence: Not on file      Review of Systems  Constitutional:  Negative for chills, fatigue and unexpected weight change.  HENT:  Negative for congestion, rhinorrhea, sneezing and sore throat.   Eyes:  Negative for redness.  Respiratory:  Negative for cough, chest tightness and shortness of breath.   Cardiovascular:  Negative for chest pain and palpitations.  Gastrointestinal:  Negative for abdominal pain, constipation, diarrhea, nausea and vomiting.  Genitourinary:  Negative for dysuria and frequency.  Musculoskeletal:  Negative for arthralgias, back pain, joint swelling and neck pain.  Skin:  Negative for rash.  Neurological: Negative.  Negative for tremors and numbness.  Hematological:  Negative  for adenopathy. Does not bruise/bleed easily.  Psychiatric/Behavioral:  Negative for behavioral problems (Depression), sleep disturbance and suicidal ideas. The patient is not nervous/anxious.    Vital Signs: BP (!) 146/96   Pulse 98   Temp (!) 97.4 F (36.3 C)   Resp 16   Ht 5' (1.524 m)   Wt 238 lb 6.4 oz (108.1 kg)   SpO2 99%   BMI 46.56 kg/m    Physical Exam Vitals reviewed.  Constitutional:      General: She is not in acute distress.    Appearance: She is well-developed. She is not diaphoretic.  HENT:     Head: Normocephalic and atraumatic.  Neck:     Thyroid: No thyromegaly.     Vascular: No JVD.     Trachea: No tracheal deviation.  Cardiovascular:     Rate and Rhythm: Normal rate and regular rhythm.     Pulses: Normal pulses.     Heart sounds: Normal heart sounds. No murmur heard.   No friction rub. No gallop.  Pulmonary:     Effort: Pulmonary effort is normal. No respiratory distress.     Breath sounds: No wheezing or rales.  Chest:     Chest wall: No tenderness.  Skin:    General: Skin is warm and dry.     Capillary Refill: Capillary refill takes less than 2 seconds.  Neurological:     Mental Status: She is alert and oriented to person, place, and time.  Psychiatric:        Mood and Affect: Mood normal.        Behavior: Behavior normal.    Assessment/Plan: 1. Essential hypertension Patient was originally taking amlodipine 2.5 mg daily, she was previously instructed to double up for a total of 5 mg daily. Blood pressure is still not well controlled. Increase amlodipine to 10 mg daily. Follow up in 4 weeks.  - amLODipine (NORVASC) 10 MG tablet; Take 1 tablet (10 mg total) by mouth daily.  Dispense: 90 tablet; Refill: 0  2. Morbid obesity with BMI of 45.0-49.9, adult [facility]) Patient started on Rybelsus for morbid obesity and impaired fasting glucose. Recheck weight in 4 weeks. - Semaglutide (RYBELSUS) 3 MG TABS; Take 3 mg by mouth daily before breakfast.   Dispense: 30 tablet; Refill: 0 - POCT glycosylated hemoglobin (Hb A1C)  3. OSA (obstructive sleep apnea) History of sleep apnea. She did a home sleep study but has not done a sleep study at the sleep clinic. She reports being concerned that her insurance will not pay for the sleep study in the clinic. Will reorder sleep study.   4. Vitamin D deficiency History of vitamin D deficiency, will recheck vitamin D level.  - Vitamin D (25 hydroxy)  5. Encounter for routine laboratory testing Routine labs ordered, will call  with results.  - CBC with Differential/Platelet - CMP14+EGFR - TSH + free T4 - Lipid Panel With LDL/HDL Ratio   General Counseling: Airyana verbalizes understanding of the findings of todays visit and agrees with plan of treatment. I have discussed any further diagnostic evaluation that may be needed or ordered today. We also reviewed her medications today. she has been encouraged to call the office with any questions or concerns that should arise related to todays visit.    Orders Placed This Encounter  Procedures   CBC with Differential/Platelet   CMP14+EGFR   TSH + free T4   Lipid Panel With LDL/HDL Ratio   Vitamin D (25 hydroxy)   POCT glycosylated hemoglobin (Hb A1C)    Meds ordered this encounter  Medications   amLODipine (NORVASC) 10 MG tablet    Sig: Take 1 tablet (10 mg total) by mouth daily.    Dispense:  90 tablet    Refill:  0   Semaglutide (RYBELSUS) 3 MG TABS    Sig: Take 3 mg by mouth daily before breakfast.    Dispense:  30 tablet    Refill:  0    Return in about 4 weeks (around 11/07/2020) for F/U, BP check, Weight loss, Jaymes Revels PCP.   Total time spent:30 Minutes Time spent includes review of chart, medications, test results, and follow up plan with the patient.   [place] Controlled Substance Database was reviewed by me.  This patient was seen by Jonetta Osgood, FNP-C in collaboration with Dr. Clayborn Bigness as a part of collaborative care  agreement.   Jaidin Ugarte R. Valetta Fuller, MSN, FNP-C Internal medicine

## 2020-10-11 ENCOUNTER — Other Ambulatory Visit: Payer: Self-pay | Admitting: Internal Medicine

## 2020-10-11 NOTE — Progress Notes (Signed)
CPAP needed ?

## 2020-10-13 DIAGNOSIS — G4733 Obstructive sleep apnea (adult) (pediatric): Secondary | ICD-10-CM | POA: Insufficient documentation

## 2020-10-13 DIAGNOSIS — G4719 Other hypersomnia: Secondary | ICD-10-CM | POA: Insufficient documentation

## 2020-10-13 NOTE — Procedures (Signed)
[facility]  Portable Polysomnogram Report Part 1 Phone: [phone] Fax: [phone]  Patient Name: Ellen Richardson, Ellen Richardson Recording Device: Tawana Scale  D.O.B.: 21-May-1985 Acquisition Number: [id number]  Referring Physician: Leotis Shames McDonough,PA-C  Acquisition Date: 10/02/2020   History: The patient is a 35 years old female who was referred for re-evaluation of possible sleep apnea.  Medical History: hypertension, migraines, anemia.  Medications: amlodipine, propranolol, sumatriptan.  PROCEDURE  The unattended portable polysomnogram was conducted on the night of 10/02/2020.  The following parameters were monitored: Nasal and oral airflow, and body position. Additionally, thoracic and abdominal movements were recorded by inductance plethysmography. Oxygen saturation (SpO2) and heart rate (ECG) was monitored using a pulse Oximeter.  The tracing was scored using 30 second epochs. Hypopneas were scored per AASM definition VIIID1.B (4% desaturation).   Description: The total recording time was 384.4 minutes. Sleep parameters are not recorded.  Respiratory monitoring demonstrated significant snoring across the night in all positions. There were a total of 35 apneas and hypopneas for a Respiratory Event Index of 5.8 apneas and hypopneas per hour of recording. The average duration of the respiratory events was 18.0 seconds with a maximum duration of 36.0 seconds. The respiratory events were associated with peripheral oxygen desaturations on the average to 96 %. The lowest oxygen desaturation associated with a respiratory event was 88 %. Additionally, the mean oxygen saturation was 96 %. The total duration of oxygen < 90% was 0.1 minutes and <80% was 0.0 minutes.   Cardiac monitoring- The average heart rate during the recording was 90.9 bpm.  Impression: This repeat overnight portable polysomnogram demonstrated the presence of obstructive sleep apnea. Overall the Respiratory  Event Index was 5.8 apneas and hypopneas per hour of recording.   Recommendations:     A CPAP titration would be recommended due to the severity of the sleep apnea. Would recommend weight loss in a patient with a BMI of 48.4 lb/in2.  Ellen Pax, MD, [facility] Diplomate ABMS-Pulmonary, Critical Care and Sleep Medicine  Electronically reviewed and digitally signed    [facility]  Portable Polysomnogram Report Part 2 Phone: [phone] Fax: [phone]    Study Date: 10/02/2020  Patient Name: Ellen, Richardson Recording Device: Tawana Scale  Sex: F Height: 60.0 in.  D.O.B.: Jan 24, 1986 Weight: 248.0 lbs.  Age: 45 years B.M.I: 48.4 lb/in2   Times and Durations  Lights off clock time:  10:50:51 PM Total Recording Time (TRT): 384.4 minutes  Lights on clock time: 5:15:15 AM Time In Bed (TIB): 384.4 minutes   Summary  AHI 5.8 OAI 0.5 CAI 0.0 Lowest Desat 88  AHI is the number of apneas and hypopneas per hour. OAI is the number of obstructive apneas per hour. CAI is the number of central apneas per hour. Lowest Desat is the lowest blood oxygen level that lasted at least 2 seconds.  RESPIRATORY EVENTS   Index (#/hour) Total # of Events Mean duration  (sec) Max duration  (sec) # of Events by Position       Supine Prone Left Right Up  Central Apneas 0.0 0 0.0 0.0 0       0 0  Obstructive Apneas 0.5 3 13.7 15.5 3       0 0  Mixed Apneas 0.0 0 0.0 0.0 0       0 0  Hypopneas 5.3 32 18.4 36.0 19       13 0  Apneas + Hypopneas 5.8 35 18.0 36.0  22       13 0  Total 5.8 35 18.0 36.0 22       13 0  Time in Position 260.6       100.5 23.3  AHI in Position 5.1       7.8 0.0    Oximetry Summary   Dur. (min) % TIB  <90 % 0.1 0.0  <85 % 0.0 0.0  <80 % 0.0 0.0  <70 % 0.0 0.0  Total Dur (min) < 89 0.1 min  Average (%) 96  Total # of Desats 79  Desat Index (#/hour) 13.3  Desat Max (%) 9  Desat Max dur (sec) 41.0  Lowest SpO2 % during sleep 88  Duration of Min SpO2 (sec) 5     Heart Rate Stats  Mean HR during sleep (BPM)  Highest HR during sleep 115  (BPM)  Highest HR during TIB  115 (BPM)    Snoring Summary  Total Snoring Episodes 12  Total Duration with Snoring 2.0 minutes  Mean Duration of Snoring 10.1 seconds  Percentage of Snoring 0.6 %

## 2020-10-13 NOTE — Procedures (Signed)
[facility]  Portable Polysomnogram Report Part 1 Phone: [phone] Fax: [phone]  Patient Name: Ellen Richardson, Ellen Richardson Recording Device: Tawana Scale  D.O.B.: October 12, 1985 Acquisition Number: [id number]  Referring Physician: Leotis Shames McDonough,PA-C  Acquisition Date: 10/01/2020   History: The patient is a 35 years old female who was referred for evaluation of possible sleep apnea.  Medical History: hypertension, migraines, anemia.  Medications: amlodipine, propranolol, sumatriptan.  PROCEDURE  The unattended portable polysomnogram was conducted on the night of 10/01/2020.  The following parameters were monitored: Nasal and oral airflow, and body position. Additionally, thoracic and abdominal movements were recorded by inductance plethysmography. Oxygen saturation (SpO2) and heart rate (ECG) was monitored using a pulse Oximeter.  The tracing was scored using 30 second epochs. Hypopneas were scored per AASM definition VIIID1.B (4% desaturation).    Description: The total recording time was 433.3 minutes. Sleep parameters are not recorded.  Respiratory monitoring demonstrated significant snoring across the night in all positions. There were a total of 21 apneas and hypopneas for a Respiratory Event Index of 4.5 apneas and hypopneas per hour of recording. The average duration of the respiratory events was 17.1 seconds with a maximum duration of 31.5 seconds. The respiratory events were associated with peripheral oxygen desaturations on the average to 95 %. The lowest oxygen desaturation associated with a respiratory event was 83 %. Additionally, the mean oxygen saturation was 95 %. The total duration of oxygen < 90% was 14.2 minutes and <80% was 0.0 minutes.   Cardiac monitoring- The average heart rate during the recording was 93.7 bpm.  Impression: This routine overnight portable polysomnogram did not demonstrate obstructive sleep apnea. Overall the Respiratory Event Index  was 4.5 apneas and hypopneas per hour of recording. The study was technically limited as oximetry data were missing for the last 2.5 hours of the recording.   Recommendations:     A negative home study does not necessarily rule out significant sleep apnea and a repeat study is recommended. Would recommend weight loss in a patient with a BMI of 48.4 lb/in2.   Yevonne Pax, MD, [facility] Diplomate ABMS-Pulmonary, Critical Care and Sleep Medicine  Electronically reviewed and digitally signed    [facility]  Portable Polysomnogram Report Part 2 Phone: [phone] Fax: [phone]    Study Date: 10/01/2020  Patient Name: Ellen Richardson, Ellen Richardson Recording Device: Tawana Scale  Sex: F Height: 60.0 in.  D.O.B.: 1985-05-12 Weight: 248.0 lbs.  Age: 47 years B.M.I: 48.4 lb/in2   Times and Durations  Lights off clock time:  10:25:13 PM Total Recording Time (TRT): 433.3 minutes  Lights on clock time: 5:38:31 AM Time In Bed (TIB): 433.3 minutes   Summary  AHI 4.5 OAI 1.3 CAI 0.0 Lowest Desat 83  AHI is the number of apneas and hypopneas per hour. OAI is the number of obstructive apneas per hour. CAI is the number of central apneas per hour. Lowest Desat is the lowest blood oxygen level that lasted at least 2 seconds.  RESPIRATORY EVENTS   Index (#/hour) Total # of Events Mean duration  (sec) Max duration  (sec) # of Events by Position       Supine Prone Left Right Up  Central Apneas 0.0 0 0.0 0.0 0    0 0 0  Obstructive Apneas 1.3 6 17.3 28.5 0    3 3 0  Mixed Apneas 0.6 3 10.8 11.5 1    1 1  0  Hypopneas 2.6 12 18.7 31.5  0    0 12 0  Apneas + Hypopneas 4.5 21 17.1 31.5 1    4 16  0  Total 4.5 21 17.1 31.5 1    4 16  0  Time in Position 32.7    41.6 206.3 152.7  AHI in Position 1.8    5.8 4.7 0.0    Oximetry Summary   Dur. (min) % TIB  <90 % 14.2 3.3  <85 % 13.9 3.2  <80 % 0.0 0.0  <70 % 0.0 0.0  Total Dur (min) < 89 13.9 min  Average (%) 95  Total # of Desats 24  Desat Index  (#/hour) 8.4  Desat Max (%) 7  Desat Max dur (sec) 35.0  Lowest SpO2 % during sleep 83  Duration of Min SpO2 (sec) 5    Heart Rate Stats  Mean HR during sleep (BPM)  Highest HR during sleep 118  (BPM)  Highest HR during TIB  118 (BPM)    Snoring Summary  Total Snoring Episodes 2  Total Duration with Snoring 0.2 minutes  Mean Duration of Snoring 5.0 seconds  Percentage of Snoring 0.1 %

## 2020-10-16 DIAGNOSIS — I10 Essential (primary) hypertension: Secondary | ICD-10-CM | POA: Insufficient documentation

## 2020-10-16 DIAGNOSIS — Z6841 Body Mass Index (BMI) 40.0 and over, adult: Secondary | ICD-10-CM | POA: Insufficient documentation

## 2020-10-19 LAB — CMP14+EGFR
ALT: 6 IU/L (ref 0–32)
AST: 8 IU/L (ref 0–40)
Albumin/Globulin Ratio: 1.4 (ref 1.2–2.2)
Albumin: 4.1 g/dL (ref 3.8–4.8)
Alkaline Phosphatase: 55 IU/L (ref 44–121)
BUN/Creatinine Ratio: 8 — ABNORMAL LOW (ref 9–23)
BUN: 6 mg/dL (ref 6–20)
Bilirubin Total: 0.4 mg/dL (ref 0.0–1.2)
CO2: 20 mmol/L (ref 20–29)
Calcium: 8.9 mg/dL (ref 8.7–10.2)
Chloride: 102 mmol/L (ref 96–106)
Creatinine, Ser: 0.74 mg/dL (ref 0.57–1.00)
Globulin, Total: 2.9 g/dL (ref 1.5–4.5)
Glucose: 89 mg/dL (ref 65–99)
Potassium: 3.9 mmol/L (ref 3.5–5.2)
Sodium: 138 mmol/L (ref 134–144)
Total Protein: 7 g/dL (ref 6.0–8.5)
eGFR: 110 mL/min/{1.73_m2} (ref >59)

## 2020-10-19 LAB — CBC WITH DIFFERENTIAL/PLATELET
Basophils Absolute: 0 10*3/uL (ref 0.0–0.2)
Basophils Absolute: 0 10*3/uL (ref 0.0–0.2)
Basos: 0 %
Basos: 1 %
EOS (ABSOLUTE): 0.1 10*3/uL (ref 0.0–0.4)
EOS (ABSOLUTE): 0.1 10*3/uL (ref 0.0–0.4)
Eos: 2 %
Eos: 2 %
Hematocrit: 34.7 % (ref 34.0–46.6)
Hematocrit: 35 % (ref 34.0–46.6)
Hemoglobin: 11.4 g/dL (ref 11.1–15.9)
Hemoglobin: 11.5 g/dL (ref 11.1–15.9)
Immature Grans (Abs): 0 10*3/uL (ref 0.0–0.1)
Immature Grans (Abs): 0 10*3/uL (ref 0.0–0.1)
Immature Granulocytes: 0 %
Immature Granulocytes: 0 %
Lymphocytes Absolute: 1.7 10*3/uL (ref 0.7–3.1)
Lymphocytes Absolute: 1.9 10*3/uL (ref 0.7–3.1)
Lymphs: 42 %
Lymphs: 44 %
MCH: 26.5 pg — ABNORMAL LOW (ref 26.6–33.0)
MCH: 26.9 pg (ref 26.6–33.0)
MCHC: 32.6 g/dL (ref 31.5–35.7)
MCHC: 33.1 g/dL (ref 31.5–35.7)
MCV: 81 fL (ref 79–97)
MCV: 81 fL (ref 79–97)
Monocytes Absolute: 0.2 10*3/uL (ref 0.1–0.9)
Monocytes Absolute: 0.3 10*3/uL (ref 0.1–0.9)
Monocytes: 6 %
Monocytes: 7 %
Neutrophils Absolute: 1.9 10*3/uL (ref 1.4–7.0)
Neutrophils Absolute: 2.2 10*3/uL (ref 1.4–7.0)
Neutrophils: 47 %
Neutrophils: 49 %
Platelets: 280 10*3/uL (ref 150–450)
Platelets: 285 10*3/uL (ref 150–450)
RBC: 4.27 x10E6/uL (ref 3.77–5.28)
RBC: 4.3 x10E6/uL (ref 3.77–5.28)
RDW: 14.5 % (ref 11.7–15.4)
RDW: 14.7 % (ref 11.7–15.4)
WBC: 4 10*3/uL (ref 3.4–10.8)
WBC: 4.6 10*3/uL (ref 3.4–10.8)

## 2020-10-19 LAB — LIPID PANEL WITH LDL/HDL RATIO
Cholesterol, Total: 145 mg/dL (ref 100–199)
Cholesterol, Total: 149 mg/dL (ref 100–199)
HDL: 33 mg/dL — ABNORMAL LOW (ref >39)
HDL: 35 mg/dL — ABNORMAL LOW (ref >39)
LDL Chol Calc (NIH): 94 mg/dL (ref 0–99)
LDL Chol Calc (NIH): 95 mg/dL (ref 0–99)
LDL/HDL Ratio: 2.7 ratio (ref 0.0–3.2)
LDL/HDL Ratio: 2.8 ratio (ref 0.0–3.2)
Triglycerides: 100 mg/dL (ref 0–149)
Triglycerides: 98 mg/dL (ref 0–149)
VLDL Cholesterol Cal: 18 mg/dL (ref 5–40)
VLDL Cholesterol Cal: 19 mg/dL (ref 5–40)

## 2020-10-19 LAB — COMPREHENSIVE METABOLIC PANEL
ALT: 7 IU/L (ref 0–32)
AST: 8 IU/L (ref 0–40)
Albumin/Globulin Ratio: 1.4 (ref 1.2–2.2)
Albumin: 4.1 g/dL (ref 3.8–4.8)
Alkaline Phosphatase: 58 IU/L (ref 44–121)
BUN/Creatinine Ratio: 7 — ABNORMAL LOW (ref 9–23)
BUN: 6 mg/dL (ref 6–20)
Bilirubin Total: 0.3 mg/dL (ref 0.0–1.2)
CO2: 20 mmol/L (ref 20–29)
Calcium: 9 mg/dL (ref 8.7–10.2)
Chloride: 103 mmol/L (ref 96–106)
Creatinine, Ser: 0.83 mg/dL (ref 0.57–1.00)
Globulin, Total: 2.9 g/dL (ref 1.5–4.5)
Glucose: 99 mg/dL (ref 65–99)
Potassium: 3.9 mmol/L (ref 3.5–5.2)
Sodium: 139 mmol/L (ref 134–144)
Total Protein: 7 g/dL (ref 6.0–8.5)
eGFR: 96 mL/min/{1.73_m2} (ref >59)

## 2020-10-19 LAB — VITAMIN D 25 HYDROXY (VIT D DEFICIENCY, FRACTURES)
Vit D, 25-Hydroxy: 11.7 ng/mL — ABNORMAL LOW (ref 30.0–100.0)
Vit D, 25-Hydroxy: 13.5 ng/mL — ABNORMAL LOW (ref 30.0–100.0)

## 2020-10-19 LAB — VITAMIN B12: Vitamin B-12: 998 pg/mL (ref 232–1245)

## 2020-10-19 LAB — TSH+FREE T4
Free T4: 1.06 ng/dL (ref 0.82–1.77)
Free T4: 1.06 ng/dL (ref 0.82–1.77)
TSH: 1.22 u[IU]/mL (ref 0.450–4.500)
TSH: 1.33 u[IU]/mL (ref 0.450–4.500)

## 2020-10-21 ENCOUNTER — Telehealth: Payer: Self-pay

## 2020-10-21 NOTE — Telephone Encounter (Signed)
Rybelsus was denied, pt aware, Alyssa aware

## 2020-10-26 ENCOUNTER — Other Ambulatory Visit: Payer: Self-pay | Admitting: Nurse Practitioner

## 2020-10-26 DIAGNOSIS — E559 Vitamin D deficiency, unspecified: Secondary | ICD-10-CM

## 2020-10-26 MED ORDER — VITAMIN D (ERGOCALCIFEROL) 1.25 MG (50000 UNIT) PO CAPS: 50000.0000 [IU] | ORAL_CAPSULE | ORAL | 2 refills | Status: DC

## 2020-11-03 ENCOUNTER — Telehealth: Payer: Self-pay

## 2020-11-03 NOTE — Telephone Encounter (Signed)
Spoke with patient about lab results, also inform patient vit-d was low and a script was sent into the pharmacy, an also HDL level was low and let her know she could take a fish oil supplement OTC.LNB

## 2020-11-24 ENCOUNTER — Other Ambulatory Visit (HOSPITAL_COMMUNITY): Payer: Self-pay | Admitting: Orthopedic Surgery

## 2020-11-24 ENCOUNTER — Ambulatory Visit: Payer: 59 | Admitting: Nurse Practitioner

## 2020-11-24 ENCOUNTER — Other Ambulatory Visit: Payer: Self-pay | Admitting: Orthopedic Surgery

## 2020-11-24 DIAGNOSIS — M25511 Pain in right shoulder: Secondary | ICD-10-CM

## 2020-11-25 ENCOUNTER — Other Ambulatory Visit: Payer: Self-pay

## 2020-11-25 ENCOUNTER — Telehealth: Payer: 59 | Admitting: Physician Assistant

## 2020-11-30 ENCOUNTER — Ambulatory Visit: Payer: 59 | Admitting: Nurse Practitioner

## 2020-12-02 ENCOUNTER — Other Ambulatory Visit: Payer: Self-pay

## 2020-12-02 ENCOUNTER — Telehealth: Payer: 59 | Admitting: Physician Assistant

## 2020-12-08 ENCOUNTER — Ambulatory Visit: Payer: 59 | Admitting: Nurse Practitioner

## 2020-12-12 ENCOUNTER — Ambulatory Visit: Admission: RE | Admit: 2020-12-12 | Payer: 59 | Source: Ambulatory Visit

## 2020-12-12 ENCOUNTER — Ambulatory Visit: Payer: 59 | Attending: Orthopedic Surgery

## 2021-01-16 ENCOUNTER — Encounter: Payer: Self-pay | Admitting: Neurology

## 2021-01-16 ENCOUNTER — Ambulatory Visit: Payer: 59 | Admitting: Neurology

## 2021-01-16 DIAGNOSIS — G43009 Migraine without aura, not intractable, without status migrainosus: Secondary | ICD-10-CM | POA: Diagnosis not present

## 2021-01-16 MED ORDER — TOPIRAMATE 25 MG PO TABS: 25.0000 mg | ORAL_TABLET | Freq: Every day | ORAL | 3 refills | Status: DC

## 2021-01-16 MED ORDER — PROPRANOLOL HCL ER 120 MG PO CP24: 120.0000 mg | ORAL_CAPSULE | Freq: Every day | ORAL | 6 refills | Status: DC

## 2021-01-16 MED ORDER — ONDANSETRON 4 MG PO TBDP: 4.0000 mg | ORAL_TABLET | Freq: Three times a day (TID) | ORAL | 3 refills | Status: DC | PRN

## 2021-01-16 MED ORDER — RIZATRIPTAN BENZOATE 10 MG PO TBDP: 10.0000 mg | ORAL_TABLET | ORAL | 11 refills | Status: DC | PRN

## 2021-01-16 NOTE — Patient Instructions (Addendum)
Acute/emergency: Ondansetron(zofran) and Rizatriptan(Maxalt) right away. Please take one tablet at the onset of your headache. If it does not improve the symptoms please take one additional tablet. Do not take more then 2 tablets in 24hrs. Do not take use more then 2 to 3 times in a week.  PREVENTION: Propranol: Increase to  at bedtime; This is a good migraine prevention medication  Start Topiramate at bedtime; This is a good migraine prevention medication and it also helps with weight   If these medications do NOT help or you have side effects, mychart me and we can try Ajovy, Aimovig or Qulipta. Send me a [employer].    Erenumab injection What is this medication? ERENUMAB (e REN ue mab) is used to prevent migraine headaches. This medicine may be used for other purposes; ask your health care provider or pharmacist if you have questions. COMMON BRAND NAME(S): Aimovig What should I tell my care team before I take this medication? They need to know if you have any of these conditions: an unusual or allergic reaction to erenumab, latex, other medicines, foods, dyes, or preservatives high blood pressure pregnant or trying to get pregnant breast-feeding How should I use this medication? This medicine is for injection under the skin. You will be taught how to prepare and give this medicine. Use exactly as directed. Take your medicine at regular intervals. Do not take your medicine more often than directed. It is important that you put your used needles and syringes in a special sharps container. Do not put them in a trash can. If you do not have a sharps container, call your pharmacist or healthcare provider to get one. Talk to your pediatrician regarding the use of this medicine in children. Special care may be needed. Overdosage: If you think you have taken too much of this medicine contact a poison control center or emergency room at once. NOTE: This medicine is only for you. Do not  share this medicine with others. What if I miss a dose? If you miss a dose, take it as soon as you can. If it is almost time for your next dose, take only that dose. Do not take double or extra doses. What may interact with this medication? Interactions are not expected. This list may not describe all possible interactions. Give your health care provider a list of all the medicines, herbs, non-prescription drugs, or dietary supplements you use. Also tell them if you smoke, drink alcohol, or use illegal drugs. Some items may interact with your medicine. What should I watch for while using this medication? Tell your doctor or healthcare professional if your symptoms do not start to get better or if they get worse. What side effects may I notice from receiving this medication? Side effects that you should report to your doctor or health care professional as soon as possible: allergic reactions like skin rash, itching or hives, swelling of the face, lips, or tongue chest pain fast, irregular heartbeat feeling faint or lightheaded palpitations Side effects that usually do not require medical attention (report these to your doctor or health care professional if they continue or are bothersome): constipation muscle cramps pain, redness, or irritation at site where injected This list may not describe all possible side effects. Call your doctor for medical advice about side effects. You may report side effects to FDA at 1-800-FDA-1088. Where should I keep my medication? Keep out of the reach of children. You will be instructed on how to store this medicine.  Throw away any unused medicine after the expiration date on the label. NOTE: This sheet is a summary. It may not cover all possible information. If you have questions about this medicine, talk to your doctor, pharmacist, or health care provider.  2022 Elsevier/Gold Standard (2018-09-01 15:43:58) Vernell Barrier injection What is this  medication? FREMANEZUMAB (fre ma NEZ ue mab) is used to prevent migraine headaches. This medicine may be used for other purposes; ask your health care provider or pharmacist if you have questions. COMMON BRAND NAME(S): AJOVY What should I tell my care team before I take this medication? They need to know if you have any of these conditions: an unusual or allergic reaction to fremanezumab, other medicines, foods, dyes, or preservatives pregnant or trying to get pregnant breast-feeding How should I use this medication? This medicine is for injection under the skin. You will be taught how to prepare and give this medicine. Use exactly as directed. Take your medicine at regular intervals. Do not take your medicine more often than directed. It is important that you put your used needles and syringes in a special sharps container. Do not put them in a trash can. If you do not have a sharps container, call your pharmacist or healthcare provider to get one. Talk to your pediatrician regarding the use of this medicine in children. Special care may be needed. Overdosage: If you think you have taken too much of this medicine contact a poison control center or emergency room at once. NOTE: This medicine is only for you. Do not share this medicine with others. What if I miss a dose? If you miss a dose, take it as soon as you can. If it is almost time for your next dose, take only that dose. Do not take double or extra doses. What may interact with this medication? Interactions are not expected. This list may not describe all possible interactions. Give your health care provider a list of all the medicines, herbs, non-prescription drugs, or dietary supplements you use. Also tell them if you smoke, drink alcohol, or use illegal drugs. Some items may interact with your medicine. What should I watch for while using this medication? Tell your doctor or healthcare professional if your symptoms do not start to get  better or if they get worse. What side effects may I notice from receiving this medication? Side effects that you should report to your doctor or health care professional as soon as possible: allergic reactions like skin rash, itching or hives, swelling of the face, lips, or tongue Side effects that usually do not require medical attention (report these to your doctor or health care professional if they continue or are bothersome): pain, redness, or irritation at site where injected This list may not describe all possible side effects. Call your doctor for medical advice about side effects. You may report side effects to FDA at 1-800-FDA-1088. Where should I keep my medication? Keep out of the reach of children. You will be instructed on how to store this medicine. Throw away any unused medicine after the expiration date on the label. NOTE: This sheet is a summary. It may not cover all possible information. If you have questions about this medicine, talk to your doctor, pharmacist, or health care provider.  2022 Elsevier/Gold Standard (2017-01-14 17:22:56)    Ondansetron Dissolving Tablets What is this medication? ONDANSETRON (on DAN se tron) prevents nausea and vomiting from chemotherapy, radiation, or surgery. It works by blocking substances in the body that may cause  nausea or vomiting. It belongs to a group of medications called antiemetics. This medicine may be used for other purposes; ask your health care provider or pharmacist if you have questions. COMMON BRAND NAME(S): Zofran ODT What should I tell my care team before I take this medication? They need to know if you have any of these conditions: Heart disease History of irregular heartbeat Liver disease Low levels of magnesium or potassium in the blood An unusual or allergic reaction to ondansetron, granisetron, other medications, foods, dyes, or preservatives Pregnant or trying to get pregnant Breast-feeding How should I use  this medication? These tablets are made to dissolve in the mouth. Do not try to push the tablet through the foil backing. With dry hands, peel away the foil backing and gently remove the tablet. Place the tablet in the mouth and allow it to dissolve, then swallow. While you may take these tablets with water, it is not necessary to do so. Talk to your care team regarding the use of this medication in children. Special care may be needed. Overdosage: If you think you have taken too much of this medicine contact a poison control center or emergency room at once. NOTE: This medicine is only for you. Do not share this medicine with others. What if I miss a dose? If you miss a dose, take it as soon as you can. If it is almost time for your next dose, take only that dose. Do not take double or extra doses. What may interact with this medication? Do not take this medication with any of the following: Apomorphine Certain medications for fungal infections like fluconazole, itraconazole, ketoconazole, posaconazole, voriconazole Cisapride Dronedarone Pimozide Thioridazine This medication may also interact with the following: Carbamazepine Certain medications for depression, anxiety, or psychotic disturbances Fentanyl Linezolid MAOIs like Carbex, Eldepryl, Marplan, Nardil, and Parnate Methylene blue (injected into a vein) Other medications that prolong the QT interval (cause an abnormal heart rhythm) like dofetilide, ziprasidone Phenytoin Rifampicin Tramadol This list may not describe all possible interactions. Give your health care provider a list of all the medicines, herbs, non-prescription drugs, or dietary supplements you use. Also tell them if you smoke, drink alcohol, or use illegal drugs. Some items may interact with your medicine. What should I watch for while using this medication? Check with your care team as soon as you can if you have any sign of an allergic reaction. What side effects  may I notice from receiving this medication? Side effects that you should report to your care team as soon as possible: Allergic reactions-skin rash, itching, hives, swelling of the face, lips, tongue, or throat Bowel blockage-stomach cramping, unable to have a bowel movement or pass gas, loss of appetite, vomiting Chest pain (angina)-pain, pressure, or tightness in the chest, neck, back, or arms Heart rhythm changes-fast or irregular heartbeat, dizziness, feeling faint or lightheaded, chest pain, trouble breathing Irritability, confusion, fast or irregular heartbeat, muscle stiffness, twitching muscles, sweating, high fever, seizure, chills, vomiting, diarrhea, which may be signs of serotonin syndrome Side effects that usually do not require medical attention (report to your care team if they continue or are bothersome): Constipation Diarrhea General discomfort and fatigue Headache This list may not describe all possible side effects. Call your doctor for medical advice about side effects. You may report side effects to FDA at 1-800-FDA-1088. Where should I keep my medication? Keep out of the reach of children and pets. Store between 2 and 30 degrees C (36 and 86 degrees  F). Throw away any unused medication after the expiration date. NOTE: This sheet is a summary. It may not cover all possible information. If you have questions about this medicine, talk to your doctor, pharmacist, or health care provider.  2022 Elsevier/Gold Standard (2020-05-06 14:39:19) Rizatriptan Disintegrating Tablets What is this medication? RIZATRIPTAN (rye za TRIP tan) treats migraines. It works by blocking pain signals and narrowing blood vessels in the brain. It belongs to a group of medications called triptans. It is not used to prevent migraines. This medicine may be used for other purposes; ask your health care provider or pharmacist if you have questions. COMMON BRAND NAME(S): Maxalt-MLT What should I tell my  care team before I take this medication? They need to know if you have any of these conditions: Cigarette smoker Circulation problems in fingers and toes Diabetes Heart disease High blood pressure High cholesterol History of irregular heartbeat History of stroke Kidney disease Liver disease Stomach or intestine problems An unusual or allergic reaction to rizatriptan, other medications, foods, dyes, or preservatives Pregnant or trying to get pregnant Breast-feeding How should I use this medication? Take this medication by mouth. Follow the directions on the prescription label. Leave the tablet in the sealed blister pack until you are ready to take it. With dry hands, open the blister and gently remove the tablet. If the tablet breaks or crumbles, throw it away and take a new tablet out of the blister pack. Place the tablet in the mouth and allow it to dissolve, and then swallow. Do not cut, crush, or chew this medication. You do not need water to take this medication. Do not take it more often than directed. Talk to your care team regarding the use of this medication in children. While this medication may be prescribed for children as young as 6 years for selected conditions, precautions do apply. Overdosage: If you think you have taken too much of this medicine contact a poison control center or emergency room at once. NOTE: This medicine is only for you. Do not share this medicine with others. What if I miss a dose? This does not apply. This medication is not for regular use. What may interact with this medication? Do not take this medication with any of the following medications: Certain medications for migraine headache like almotriptan, eletriptan, frovatriptan, naratriptan, rizatriptan, sumatriptan, zolmitriptan Ergot alkaloids like dihydroergotamine, ergonovine, ergotamine, methylergonovine MAOIs like Carbex, Eldepryl, Marplan, Nardil, and Parnate This medication may also interact  with the following medications: Certain medications for depression, anxiety, or psychotic disorders Propranolol This list may not describe all possible interactions. Give your health care provider a list of all the medicines, herbs, non-prescription drugs, or dietary supplements you use. Also tell them if you smoke, drink alcohol, or use illegal drugs. Some items may interact with your medicine. What should I watch for while using this medication? Visit your care team for regular checks on your progress. Tell your care team if your symptoms do not start to get better or if they get worse. You may get drowsy or dizzy. Do not drive, use machinery, or do anything that needs mental alertness until you know how this medication affects you. Do not stand up or sit up quickly, especially if you are an older patient. This reduces the risk of dizzy or fainting spells. Alcohol may interfere with the effect of this medication. Your mouth may get dry. Chewing sugarless gum or sucking hard candy and drinking plenty of water may help. Contact your  care team if the problem does not go away or is severe. If you take migraine medications for 10 or more days a month, your migraines may get worse. Keep a diary of headache days and medication use. Contact your care team if your migraine attacks occur more frequently. What side effects may I notice from receiving this medication? Side effects that you should report to your care team as soon as possible: Allergic reactions-skin rash, itching, hives, swelling of the face, lips, tongue, or throat Burning, pain, tingling, or color changes in the legs or feet Heart attack-pain or tightness in the chest, shoulders, arms, or jaw, nausea, shortness of breath, cold or clammy skin, feeling faint or lightheaded Heart rhythm changes-fast or irregular heartbeat, dizziness, feeling faint or lightheaded, chest pain, trouble breathing Increase in blood pressure Irritability, confusion,  fast or irregular heartbeat, muscle stiffness, twitching muscles, sweating, high fever, seizure, chills, vomiting, diarrhea, which may be signs of serotonin syndrome Raynaud's-cool, numb, or painful fingers or toes that may change color from pale, to blue, to red Seizures Stroke-sudden numbness or weakness of the face, arm, or leg, trouble speaking, confusion, trouble walking, loss of balance or coordination, dizziness, severe headache, change in vision Sudden or severe stomach pain, nausea, vomiting, fever, or bloody diarrhea Vision loss Side effects that usually do not require medical attention (report to your care team if they continue or are bothersome): Dizziness General discomfort or fatigue This list may not describe all possible side effects. Call your doctor for medical advice about side effects. You may report side effects to FDA at 1-800-FDA-1088. Where should I keep my medication? Keep out of the reach of children and pets. Store at room temperature between 15 and 30 degrees C (59 and 86 degrees F). Protect from light and moisture. Throw away any unused medication after the expiration date. NOTE: This sheet is a summary. It may not cover all possible information. If you have questions about this medicine, talk to your doctor, pharmacist, or health care provider.  2022 Elsevier/Gold Standard (2020-05-11 16:19:19) Propranolol Extended-Release Capsules What is this medication? PROPRANOLOL (proe PRAN oh lole) treats many conditions such as high blood pressure and heart disease. It may also be used to prevent chest pain (angina). It works by lowering your blood pressure and heart rate, making it easier for your heart to pump blood to the rest of your body. It can also be used to prevent migraine headaches. It works by relaxing the blood vessels in the brain that cause migraines. It belongs to a group of medications called beta blockers. This medicine may be used for other purposes; ask your  health care provider or pharmacist if you have questions. COMMON BRAND NAME(S): Inderal LA, Inderal XL, InnoPran XL What should I tell my care team before I take this medication? They need to know if you have any of these conditions: Circulation problems, or blood vessel disease Diabetes History of heart attack or heart disease, vasospastic angina Kidney disease Liver disease Lung or breathing disease, like asthma or emphysema Pheochromocytoma Slow heart rate Thyroid disease An unusual or allergic reaction to propranolol, other beta-blockers, medications, foods, dyes, or preservatives Pregnant or trying to get pregnant Breast-feeding How should I use this medication? Take this medication by mouth. Take it as directed on the prescription label at the same time every day. Do not cut, crush or chew this medication. Swallow the capsules whole. You can take it with or without food. If it upsets your stomach, take  it with food. Keep taking it unless your care team tells you to stop. Talk to your care team about the use of this medication in children. Special care may be needed. Overdosage: If you think you have taken too much of this medicine contact a poison control center or emergency room at once. NOTE: This medicine is only for you. Do not share this medicine with others. What if I miss a dose? If you miss a dose, take it as soon as you can. If it is almost time for your next dose, take only that dose. Do not take double or extra doses. What may interact with this medication? Do not take this medication with any of the following: Feverfew Phenothiazines like chlorpromazine, mesoridazine, prochlorperazine, thioridazine This medication may also interact with the following: Aluminum hydroxide gel Antipyrine Antiviral medications for HIV or AIDS Barbiturates like phenobarbital Certain medications for blood pressure, heart disease, irregular heart  beat Cimetidine Ciprofloxacin Diazepam Fluconazole Haloperidol Isoniazid Medications for cholesterol like cholestyramine or colestipol Medications for mental depression Medications for migraine headache like almotriptan, eletriptan, frovatriptan, naratriptan, rizatriptan, sumatriptan, zolmitriptan NSAIDs, medications for pain and inflammation, like ibuprofen or naproxen Phenytoin Rifampin Teniposide Theophylline Thyroid medications Tolbutamide Warfarin Zileuton This list may not describe all possible interactions. Give your health care provider a list of all the medicines, herbs, non-prescription drugs, or dietary supplements you use. Also tell them if you smoke, drink alcohol, or use illegal drugs. Some items may interact with your medicine. What should I watch for while using this medication? Visit your care team for regular checks on your progress. Check your blood pressure as directed. Ask your care team what your blood pressure should be. Also, find out when you should contact him or her. Do not treat yourself for coughs, colds, or pain while you are using this medication without asking your care team for advice. Some medications may increase your blood pressure. You may get drowsy or dizzy. Do not drive, use machinery, or do anything that needs mental alertness until you know how this medication affects you. Do not stand up or sit up quickly, especially if you are an older patient. This reduces the risk of dizzy or fainting spells. Alcohol may interfere with the effect of this medication. Avoid alcoholic drinks. This medication may increase blood sugar. Ask your care team if changes in diet or medications are needed if you have diabetes. Do not suddenly stop taking this medication. You may develop severe heart-related effects. Your care team will tell you how much medication to take. If your care team wants you to stop the medication, the dose may be slowly lowered over time to avoid  any side effects. What side effects may I notice from receiving this medication? Side effects that you should report to your care team as soon as possible: Allergic reactions-skin rash, itching, hives, swelling of the face, lips, tongue, or throat Heart failure-shortness of breath, swelling of the ankles, feet, or hands, sudden weight gain, unusual weakness or fatigue Low blood pressure-dizziness, feeling faint or lightheaded, blurry vision Raynaud's-cool, numb, or painful fingers or toes that may change color from pale, to blue, to red Redness, blistering, peeling, or loosening of the skin, including inside the mouth Slow heartbeat-dizziness, feeling faint or lightheaded, confusion, trouble breathing, unusual weakness or fatigue Worsening mood, feelings of depression Side effects that usually do not require medical attention (report to your care team if they continue or are bothersome): Change in sex drive or performance Diarrhea Dizziness  Fatigue Headache This list may not describe all possible side effects. Call your doctor for medical advice about side effects. You may report side effects to FDA at 1-800-FDA-1088. Where should I keep my medication? Keep out of the reach of children and pets. Store at room temperature between 15 and 30 degrees C (59 and 86 degrees F). Protect from light and moisture. Keep the container tightly closed. Avoid exposure to extreme heat. Do not freeze. Throw away any unused medication after the expiration date. NOTE: This sheet is a summary. It may not cover all possible information. If you have questions about this medicine, talk to your doctor, pharmacist, or health care provider.  2022 Elsevier/Gold Standard (2020-08-24 13:53:22) Topiramate Tablets What is this medication? TOPIRAMATE (toe PYRE a mate) prevents and controls seizures in people with epilepsy. It may also be used to prevent migraine headaches. It works by calming overactive nerves in your  body. This medicine may be used for other purposes; ask your health care provider or pharmacist if you have questions. COMMON BRAND NAME(S): Topamax, Topiragen What should I tell my care team before I take this medication? They need to know if you have any of these conditions: Bleeding disorder Kidney disease Lung disease Suicidal thoughts, plans or attempt An unusual or allergic reaction to topiramate, other medications, foods, dyes, or preservatives Pregnant or trying to get pregnant Breast-feeding How should I use this medication? Take this medication by mouth with water. Take it as directed on the prescription label at the same time every day. Do not cut, crush or chew this medicine. Swallow the tablets whole. You can take it with or without food. If it upsets your stomach, take it with food. Keep taking it unless your care team tells you to stop. A special MedGuide will be given to you by the pharmacist with each prescription and refill. Be sure to read this information carefully each time. Talk to your care team about the use of this medication in children. While it may be prescribed for children as young as 2 years for selected conditions, precautions do apply. Overdosage: If you think you have taken too much of this medicine contact a poison control center or emergency room at once. NOTE: This medicine is only for you. Do not share this medicine with others. What if I miss a dose? If you miss a dose, take it as soon as you can unless it is within 6 hours of the next dose. If it is within 6 hours of the next dose, skip the missed dose. Take the next dose at the normal time. Do not take double or extra doses. What may interact with this medication? Acetazolamide Alcohol Antihistamines for allergy, cough, and cold Aspirin and aspirin-like medications Atropine Birth control pills Certain medications for anxiety or sleep Certain medications for bladder problems like oxybutynin,  tolterodine Certain medications for depression like amitriptyline, fluoxetine, sertraline Certain medications for Parkinson's disease like benztropine, trihexyphenidyl Certain medications for seizures like carbamazepine, lamotrigine, phenobarbital, phenytoin, primidone, valproic acid, zonisamide Certain medications for stomach problems like dicyclomine, hyoscyamine Certain medications for travel sickness like scopolamine Certain medications that treat or prevent blood clots like warfarin, enoxaparin, dalteparin, apixaban, dabigatran, and rivaroxaban Digoxin Diltiazem General anesthetics like halothane, isoflurane, methoxyflurane, propofol Glyburide Hydrochlorothiazide Ipratropium Lithium Medications that relax muscles Metformin Narcotic medications for pain NSAIDs, medications for pain and inflammation, like ibuprofen or naproxen Phenothiazines like chlorpromazine, mesoridazine, prochlorperazine, thioridazine Pioglitazone This list may not describe all possible interactions. Give your health  care provider a list of all the medicines, herbs, non-prescription drugs, or dietary supplements you use. Also tell them if you smoke, drink alcohol, or use illegal drugs. Some items may interact with your medicine. What should I watch for while using this medication? Visit your care team for regular checks on your progress. Tell your care team if your symptoms do not start to get better or if they get worse. Do not suddenly stop taking this medication. You may develop a severe reaction. Your care team will tell you how much medication to take. If your care team wants you to stop the medication, the dose may be slowly lowered over time to avoid any side effects. Wear a medical ID bracelet or chain. Carry a card that describes your condition. List the medications and doses you take on the card. You may get drowsy or dizzy. Do not drive, use machinery, or do anything that needs mental alertness until you  know how this medication affects you. Do not stand up or sit up quickly, especially if you are an older patient. This reduces the risk of dizzy or fainting spells. Alcohol may interfere with the effects of this medication. Avoid alcoholic drinks. This medication may cause serious skin reactions. They can happen weeks to months after starting the medication. Contact your care team right away if you notice fevers or flu-like symptoms with a rash. The rash may be red or purple and then turn into blisters or peeling of the skin. Or, you might notice a red rash with swelling of the face, lips or lymph nodes in your neck or under your arms. Watch for new or worsening thoughts of suicide or depression. This includes sudden changes in mood, behaviors, or thoughts. These changes can happen at any time but are more common in the beginning of treatment or after a change in dose. Call your care team right away if you experience these thoughts or worsening depression. This medication may slow your child's growth if it is taken for a long time at high doses. Your care team will monitor your child's growth. Using this medication for a long time may weaken your bones. The risk of bone fractures may be increased. Talk to your care team about your bone health. Do not become pregnant while taking this medication. Hormone forms of birth control may not work as well with this medication. Talk to your care team about other forms of birth control. There is potential for serious harm to an unborn child. Tell your care team right away if you think you might be pregnant. What side effects may I notice from receiving this medication? Side effects that you should report to your care team as soon as possible: Allergic reactions-skin rash, itching, hives, swelling of the face, lips, tongue, or throat High acid level-trouble breathing, unusual weakness or fatigue, confusion, headache, fast or irregular heartbeat, nausea, vomiting High  ammonia level-unusual weakness or fatigue, confusion, loss of appetite, nausea, vomiting, seizures Fever that does not go away, decrease in sweat Kidney stones-blood in the urine, pain or trouble passing urine, pain in the lower back or sides Redness, blistering, peeling or loosening of the skin, including inside the mouth Sudden eye pain or change in vision such as blurry vision, seeing halos around lights, vision loss Thoughts of suicide or self-harm, worsening mood, feelings of depression Side effects that usually do not require medical attention (report to your care team if they continue or are bothersome): Burning or tingling sensation in  hands or feet Difficulty with paying attention, memory, or speech Dizziness Drowsiness Fatigue Loss of appetite with weight loss Slow or sluggish movements of the body This list may not describe all possible side effects. Call your doctor for medical advice about side effects. You may report side effects to FDA at 1-800-FDA-1088. Where should I keep my medication? Keep out of the reach of children and pets. Store between 15 and 30 degrees C (59 and 86 degrees F). Protect from moisture. Keep the container tightly closed. Get rid of any unused medication after the expiration date. To get rid of medications that are no longer needed or have expired: Take the medication to a medication take-back program. Check with your pharmacy or law enforcement to find a location. If you cannot return the medication, check the label or package insert to see if the medication should be thrown out in the garbage or flushed down the toilet. If you are not sure, ask your care team. If it is safe to put it in the trash, empty the medication out of the container. Mix the medication with cat litter, dirt, coffee grounds, or other unwanted substance. Seal the mixture in a bag or container. Put it in the trash. NOTE: This sheet is a summary. It may not cover all possible  information. If you have questions about this medicine, talk to your doctor, pharmacist, or health care provider.  2022 Elsevier/Gold Standard (2020-06-07 12:37:42)

## 2021-01-16 NOTE — Progress Notes (Signed)
[facility]    Provider:  Dr Lucia Gaskins Requesting Provider: Sallyanne Kuster, NP Primary Care Provider:  Pcp, No  CC:  migraines  HPI:  Ellen Richardson is a 35 y.o. female here as requested by Sallyanne Kuster, NP for migraines. Started about a year ago. She has family history in her sister with migraines. She was getting her migraines every day but they have significantly improved. Pulsating/pounding/throbbing, all over the head, can be unilateral, endorses nausea, also endorses light/sound sensitivity, they can last all day and be moderate to severe in pain. She has 10 migraine days a month.  No known etiology or inciting event.  Sumatriptan did not help a lot. She is on a lot of medications for pain right now, meloxicam, tramadol and Is also on propranolol and norvasc per patient, she said possibly the propranolol has helped a little bit.  We discussed all her options.  We will hold off in an MRI since her symptoms appear to be improving and there is no red flags such as no vision changes, not positional or exertional, and her neurologic exam is normal.  We discussed options, prior older oral medications, the newer CGRP oral and injectable medications.  She really does not know what triggers the headaches, or makes them worse.No other focal neurologic deficits, associated symptoms, inciting events or modifiable factors.  Reviewed notes, labs and imaging from outside physicians, which showed:  From a review of records and patient report medications tried that can be used in migraine and headache management include: Failed emgality (had samples) for 5 months per patient, also propranolol which she has been on for at least 3 months does not seem to be helping a lot, she is also on amlodipine which she has been on for at least 3 months, Imitrex, starting topiramate, Maxalt.  Review of Systems: Patient complains of symptoms per HPI as well as the following symptoms headache. Pertinent  negatives and positives per HPI. All others negative.   Social History   Socioeconomic History   Marital status: Married    Spouse name: Not on file   Number of children: Not on file   Years of education: Not on file   Highest education level: Not on file  Occupational History   Not on file  Tobacco Use   Smoking status: Never   Smokeless tobacco: Never  Vaping Use   Vaping Use: Never used  Substance and Sexual Activity   Alcohol use: Yes    Comment: occ   Drug use: No   Sexual activity: Not on file  Other Topics Concern   Not on file  Social History Narrative   ** Merged History Encounter **       Social Determinants of Health   Financial Resource Strain: Not on file  Food Insecurity: Not on file  Transportation Needs: Not on file  Physical Activity: Not on file  Stress: Not on file  Social Connections: Not on file  Intimate Partner Violence: Not on file    Family History  Problem Relation Age of Onset   Hypertension Father    Diabetes Father    Migraines Sister    Hypertension Sister     Past Medical History:  Diagnosis Date   Anemia 2008   h/o   Migraine    UTI (urinary tract infection)     Patient Active Problem List   Diagnosis Date Noted   Essential hypertension 10/16/2020   Morbid obesity with BMI of 45.0-49.9, adult (HCC) 10/16/2020  Other hypersomnia 10/13/2020   OSA (obstructive sleep apnea) 10/13/2020   COVID-19 vaccination declined 09/08/2020   Bursitis of right shoulder 04/01/2017   Impingement syndrome of shoulder region 04/01/2017   Sterilization consult 09/14/2013    Past Surgical History:  Procedure Laterality Date   IUD REMOVAL  2018   SHOULDER ARTHROSCOPY WITH SUBACROMIAL DECOMPRESSION Right 04/26/2017   Procedure: SHOULDER ARTHROSCOPY WITH SUBACROMIAL DECOMPRESSION;  Surgeon: Juanell Fairly, MD;  Location: [facility] ORS;  Service: Orthopedics;  Laterality: Right;   SHOULDER SURGERY      Current Outpatient Medications   Medication Sig Dispense Refill   amLODipine (NORVASC) 10 MG tablet Take 1 tablet (10 mg total) by mouth daily. 90 tablet 0   ondansetron (ZOFRAN-ODT) 4 MG disintegrating tablet Take 1-2 tablets (4-8 mg total) by mouth every 8 (eight) hours as needed. 30 tablet 3   rizatriptan (MAXALT-MLT) 10 MG disintegrating tablet Take 1 tablet (10 mg total) by mouth as needed for migraine. May repeat in 2 hours if needed 9 tablet 11   SUMAtriptan (IMITREX) 50 MG tablet Take 1 tablet (50 mg total) by mouth every 2 (two) hours as needed for migraine. May repeat in 2 hours if headache persists or recurs. 10 tablet 0   topiramate (TOPAMAX) 25 MG tablet Take 1 tablet (25 mg total) by mouth at bedtime. 90 tablet 3   Vitamin D, Ergocalciferol, (DRISDOL) 1.25 MG (50000 UNIT) CAPS capsule Take 1 capsule (50,000 Units total) by mouth every 7 (seven) days. 5 capsule 2   propranolol ER (INDERAL LA) 120 MG 24 hr capsule Take 1 capsule (120 mg total) by mouth daily. 90 capsule 6   No current facility-administered medications for this visit.    Allergies as of 01/16/2021   (No Known Allergies)    Vitals: BP 138/89   Pulse 82   Ht 5' (1.524 m)   Wt 236 lb 12.8 oz (107.4 kg)   BMI 46.25 kg/m  Last Weight:  Wt Readings from Last 1 Encounters:  01/16/21 236 lb 12.8 oz (107.4 kg)   Last Height:   Ht Readings from Last 1 Encounters:  01/16/21 5' (1.524 m)     Physical exam: Exam: Gen: NAD, conversant, well nourised, obese, well groomed                     CV: RRR, no MRG. No Carotid Bruits. No peripheral edema, warm, nontender Eyes: Conjunctivae clear without exudates or hemorrhage  Neuro: Detailed Neurologic Exam  Speech:    Speech is normal; fluent and spontaneous with normal comprehension.  Cognition:    The patient is oriented to person, place, and time;     recent and remote memory intact;     language fluent;     normal attention, concentration,     fund of knowledge Cranial Nerves:    The  pupils are equal, round, and reactive to light. The fundi flat. Visual fields are full to finger confrontation. Extraocular movements are intact. Trigeminal sensation is intact and the muscles of mastication are normal. The face is symmetric. The palate elevates in the midline. Hearing intact. Voice is normal. Shoulder shrug is normal. The tongue has normal motion without fasciculations.   Coordination:    Normal    Gait:     normal.   Motor Observation:    No asymmetry, no atrophy, and no involuntary movements noted. Tone:    Normal muscle tone.    Posture:    Posture is normal. normal  erect    Strength:    Strength is V/V in the upper and lower limbs.      Sensation: intact to LT     Reflex Exam:  DTR's:    Deep tendon reflexes in the upper and lower extremities are normal bilaterally.   Toes:    The toes are downgoing bilaterally.   Clonus:    Clonus is absent.    Assessment/Plan: 35 year old patient with migraines.  She is tried and failed multiple medications in the past.  She states that she even tried [employer] in the past and had samples when she had gone to another neurologist and tried it for at least 5 months.  At this time we discussed all her options for acute and preventative.  Acute/emergency: Ondansetron(zofran) and Rizatriptan(Maxalt) right away. Please take one tablet (can be together) at the onset of your headache. If it does not improve the symptoms please take one additional dose. Do not take more then 2 tablets in 24hrs. Do not take use more then 2 to 3 times in a week.  PREVENTION: Propranol: Increase to 120mg  at bedtime; This is a good migraine prevention medication and may help with your elevated BP  Start Topiramate at bedtime; This is a good migraine prevention medication and it also helps with weight   If these medications do NOT help or you have side effects, mychart me and we can try Ajovy, Aimovig or Qulipta. Send me a .   At  this time she declines an MRI of the brain.  At I do not see any red flags at this time, not exertional, not positional, appear to be stable as far as quality goes, but I did inform her if her migraines do not improve or she has new or worsening symptoms I would highly encourage an MRI of the brain.  Meds ordered this encounter  Medications   rizatriptan (MAXALT-MLT) 10 MG disintegrating tablet    Sig: Take 1 tablet (10 mg total) by mouth as needed for migraine. May repeat in 2 hours if needed    Dispense:  9 tablet    Refill:  11   ondansetron (ZOFRAN-ODT) 4 MG disintegrating tablet    Sig: Take 1-2 tablets (4-8 mg total) by mouth every 8 (eight) hours as needed.    Dispense:  30 tablet    Refill:  3   propranolol ER (INDERAL LA) 120 MG 24 hr capsule    Sig: Take 1 capsule (120 mg total) by mouth daily.    Dispense:  90 capsule    Refill:  6   topiramate (TOPAMAX) 25 MG tablet    Sig: Take 1 tablet (25 mg total) by mouth at bedtime.    Dispense:  90 tablet    Refill:  3     Cc: Wellsite geologist, NP,  Pcp, No  Sallyanne Kuster, MD  [facility] [address] [place], [place]  Phone [phone] Fax [phone]  I spent 50  minutes of face-to-face and non-face-to-face time with patient on the  1. Migraine without aura and without status migrainosus, not intractable    diagnosis.  This included previsit chart review, lab review, study review, order entry, electronic health record documentation, patient education on the different diagnostic and therapeutic options, counseling and coordination of care, risks and benefits of management, compliance, or risk factor reduction

## 2021-01-17 ENCOUNTER — Encounter: Payer: Self-pay | Admitting: Neurology

## 2021-01-17 DIAGNOSIS — G43009 Migraine without aura, not intractable, without status migrainosus: Secondary | ICD-10-CM | POA: Insufficient documentation

## 2021-01-25 ENCOUNTER — Ambulatory Visit (LOCAL_COMMUNITY_HEALTH_CENTER): Payer: 59 | Admitting: Family Medicine

## 2021-01-25 ENCOUNTER — Other Ambulatory Visit: Payer: Self-pay

## 2021-01-25 ENCOUNTER — Encounter: Payer: Self-pay | Admitting: Family Medicine

## 2021-01-25 VITALS — BP 119/84 | Ht 62.0 in | Wt 232.8 lb

## 2021-01-25 DIAGNOSIS — N644 Mastodynia: Secondary | ICD-10-CM

## 2021-01-25 DIAGNOSIS — Z3009 Encounter for other general counseling and advice on contraception: Secondary | ICD-10-CM

## 2021-01-25 DIAGNOSIS — Z30013 Encounter for initial prescription of injectable contraceptive: Secondary | ICD-10-CM

## 2021-01-25 DIAGNOSIS — Z0000 Encounter for general adult medical examination without abnormal findings: Secondary | ICD-10-CM | POA: Diagnosis not present

## 2021-01-25 DIAGNOSIS — G43009 Migraine without aura, not intractable, without status migrainosus: Secondary | ICD-10-CM

## 2021-01-25 MED ORDER — MEDROXYPROGESTERONE ACETATE 150 MG/ML IM SUSP: 150.0000 mg | INTRAMUSCULAR | Status: AC

## 2021-01-25 NOTE — Progress Notes (Signed)
[facility] DEPARTMENT [facility] [address] Main Number: [phone]    Family Planning Visit- Initial Visit  Subjective:  Ellen Richardson is a 35 y.o.  [id number]   being seen today for an initial annual visit and to discuss contraceptive options.  The patient is currently using None for pregnancy prevention. Patient reports she does not want a pregnancy in the next year.  Patient has the following medical conditions has Bursitis of right shoulder; Impingement syndrome of shoulder region; Sterilization consult; COVID-19 vaccination declined; Other hypersomnia; OSA (obstructive sleep apnea); Essential hypertension; Morbid obesity with BMI of 45.0-49.9, adult (HCC); and Migraine without aura and without status migrainosus, not intractable on their problem list.  Chief Complaint  Patient presents with   Contraception    Depo and PE    Patient reports here for PE and start Depo.  Patient denies any concerns for STI's today    Body mass index is 42.58 kg/m. - Patient is eligible for diabetes screening based on BMI and age >57?  not applicable HA1C ordered? not applicable  Patient reports 1  partner/s in last year. Desires STI screening?  No - declines   Has patient been screened once for HCV in the past?  No  No results found for: HCVAB  Does the patient have current drug use (including MJ), have a partner with drug use, and/or has been incarcerated since last result? No  If yes-- Screen for HCV through [facility]   Does the patient meet criteria for HBV testing? No  Criteria:  -Household, sexual or needle sharing contact with HBV -History of drug use -HIV positive -Those with known Hep C   Health Maintenance Due  Topic Date Due   HIV Screening  Never done   Hepatitis C Screening  Never done   TETANUS/TDAP  Never done   COVID-19 Vaccine (2 - Moderna series) 06/10/2020   INFLUENZA VACCINE  Never done    Review of Systems   Constitutional:  Negative for chills, fever, malaise/fatigue and weight loss.  HENT:  Negative for congestion, hearing loss and sore throat.   Eyes:  Negative for blurred vision, double vision and photophobia.  Respiratory:  Negative for shortness of breath.   Cardiovascular:  Negative for chest pain.  Gastrointestinal:  Negative for abdominal pain, blood in stool, constipation, diarrhea, heartburn, nausea and vomiting.  Genitourinary:  Negative for dysuria and frequency.  Musculoskeletal:  Negative for back pain, joint pain and neck pain.  Skin:  Negative for itching and rash.  Neurological:  Positive for headaches. Negative for dizziness and weakness.       Pt is on medications that is prescribed by her PCP and has helped per HA's.  Less HA than before.    Endo/Heme/Allergies:  Does not bruise/bleed easily.  Psychiatric/Behavioral:  Negative for depression, substance abuse and suicidal ideas.    The following portions of the patient's history were reviewed and updated as appropriate: allergies, current medications, past family history, past medical history, past social history, past surgical history and problem list. Problem list updated.   See flowsheet for other program required questions.  Objective:   Vitals:   01/25/21 1507 01/25/21 1527  BP: (!) 145/99 119/84  Weight: 232 lb 12.8 oz (105.6 kg)   Height: 5\' 2"  (1.575 m)     Physical Exam Vitals and nursing note reviewed.  Constitutional:      Appearance: Normal appearance. She is obese.  HENT:  Head: Normocephalic and atraumatic.     Mouth/Throat:     Mouth: Mucous membranes are moist.     Pharynx: No oropharyngeal exudate or posterior oropharyngeal erythema.  Eyes:     General: No scleral icterus. Cardiovascular:     Rate and Rhythm: Normal rate and regular rhythm.     Pulses: Normal pulses.     Heart sounds: Normal heart sounds.  Pulmonary:     Effort: Pulmonary effort is normal.     Breath sounds: Normal  breath sounds.  Chest:  Breasts:    Right: No tenderness.     Left: No tenderness.     Comments: Pt declined breast exam- "I was seen at [facility] in February, they are following me for the breast mass".   Denies pain, tenderness, or skin changes.   Abdominal:     General: Abdomen is flat. Bowel sounds are normal.     Palpations: Abdomen is soft.  Genitourinary:    Comments: Deferred- pap not due and declined need for STI testing.   Musculoskeletal:        General: Normal range of motion.     Cervical back: Normal range of motion and neck supple.  Skin:    General: Skin is warm and dry.  Neurological:     General: No focal deficit present.     Mental Status: She is alert and oriented to person, place, and time.  Psychiatric:        Mood and Affect: Mood normal.        Behavior: Behavior normal.      Assessment and Plan:  Ellen Richardson is a 35 y.o. female presenting to the [facility] Department for an initial annual wellness/contraceptive visit  1. Routine general medical examination at a health care facility -Well woman exam  -declined breast exam at this visit.  -Last pap was done on 09/05/19-  PAP neg and  HPV neg.  Per patient [facility] chart via patient patient phone.  ROI sent for records.  Next pap due 09/14/2024  2. Family planning counseling  Contraception counseling: Reviewed all forms of birth control options in the tiered based approach. available including abstinence; over the counter/barrier methods; hormonal contraceptive medication including pill, patch, ring, injection,contraceptive implant, ECP; hormonal and nonhormonal IUDs; permanent sterilization options including vasectomy and the various tubal sterilization modalities. Risks, benefits, and typical effectiveness rates were reviewed.  Questions were answered.  Written information was also given to the patient to review.  Patient desires Depo provera, this was prescribed for patient. She will follow up in   11-13 weeks  for surveillance.  She was told to call with any further questions, or with any concerns about this method of contraception.  Emphasized use of condoms 100% of the time for STI prevention.  Patient was  not offered ECP based on pt declined.   3. Encounter for initial prescription of injectable contraceptive RX for Depo Provera 150 mg IM every 11-13 weeks x 1 year.   - medroxyPROGESTERone (DEPO-PROVERA) injection 150 mg  4. Migraine without aura and without status migrainosus, not intractable -is on medications prescribed by PCP and has not had any trouble with migraines recently.  -denies migraines with aura   5. Breast pain, left -Pt has history of left breast pain, mammogram & Breast U/S  done 05/2020 at [facility].    -per reports: "No suspicious solid or cystic mass is identified.  There are no suspicious masses, calcifications, or other findings in the  left breast".     Return in about 3 months (around 04/26/2021) for depo.  Future Appointments  Date Time Provider Department Center  04/18/2021  2:30 PM Butch Penny, NP [facility] None    Wendi Snipes, FNP

## 2021-01-25 NOTE — Progress Notes (Signed)
Pt here for PE and Depo injection.  Depo 150 mg given IM without any complications.  Pt given reminder card to return in 11-13 weeks for next Depo injection.

## 2021-02-24 ENCOUNTER — Encounter: Payer: Self-pay | Admitting: Emergency Medicine

## 2021-02-24 ENCOUNTER — Observation Stay
Admission: EM | Admit: 2021-02-24 | Discharge: 2021-02-25 | Disposition: A | Discharge status: Home / Self Care | Payer: 59 | Attending: General Surgery | Admitting: General Surgery

## 2021-02-24 ENCOUNTER — Observation Stay: Payer: 59 | Admitting: Certified Registered Nurse Anesthetist

## 2021-02-24 ENCOUNTER — Encounter
Admission: EM | Disposition: A | Discharge status: Home / Self Care | Payer: Self-pay | Source: Home / Self Care | Attending: Emergency Medicine

## 2021-02-24 ENCOUNTER — Other Ambulatory Visit: Payer: Self-pay

## 2021-02-24 ENCOUNTER — Emergency Department: Payer: 59

## 2021-02-24 DIAGNOSIS — K819 Cholecystitis, unspecified: Secondary | ICD-10-CM

## 2021-02-24 DIAGNOSIS — K810 Acute cholecystitis: Secondary | ICD-10-CM | POA: Diagnosis present

## 2021-02-24 DIAGNOSIS — I10 Essential (primary) hypertension: Secondary | ICD-10-CM | POA: Insufficient documentation

## 2021-02-24 DIAGNOSIS — K8012 Calculus of gallbladder with acute and chronic cholecystitis without obstruction: Secondary | ICD-10-CM | POA: Diagnosis not present

## 2021-02-24 DIAGNOSIS — R1011 Right upper quadrant pain: Secondary | ICD-10-CM

## 2021-02-24 DIAGNOSIS — Z20822 Contact with and (suspected) exposure to covid-19: Secondary | ICD-10-CM | POA: Insufficient documentation

## 2021-02-24 HISTORY — DX: Essential (primary) hypertension: I10

## 2021-02-24 LAB — URINALYSIS, ROUTINE W REFLEX MICROSCOPIC
Bacteria, UA: NONE SEEN
Bilirubin Urine: NEGATIVE
Glucose, UA: NEGATIVE mg/dL
Ketones, ur: NEGATIVE mg/dL
Leukocytes,Ua: NEGATIVE
Nitrite: NEGATIVE
Protein, ur: NEGATIVE mg/dL
Specific Gravity, Urine: 1.004 — ABNORMAL LOW (ref 1.005–1.030)
pH: 7 (ref 5.0–8.0)

## 2021-02-24 LAB — CBC
HCT: 37.5 % (ref 36.0–46.0)
Hemoglobin: 12.6 g/dL (ref 12.0–15.0)
MCH: 28.3 pg (ref 26.0–34.0)
MCHC: 33.6 g/dL (ref 30.0–36.0)
MCV: 84.3 fL (ref 80.0–100.0)
Platelets: 319 10*3/uL (ref 150–400)
RBC: 4.45 MIL/uL (ref 3.87–5.11)
RDW: 13.9 % (ref 11.5–15.5)
WBC: 7.2 10*3/uL (ref 4.0–10.5)
nRBC: 0 % (ref 0.0–0.2)

## 2021-02-24 LAB — COMPREHENSIVE METABOLIC PANEL
ALT: 10 U/L (ref 0–44)
AST: 13 U/L — ABNORMAL LOW (ref 15–41)
Albumin: 4 g/dL (ref 3.5–5.0)
Alkaline Phosphatase: 49 U/L (ref 38–126)
Anion gap: 5 (ref 5–15)
BUN: 10 mg/dL (ref 6–20)
CO2: 24 mmol/L (ref 22–32)
Calcium: 9.1 mg/dL (ref 8.9–10.3)
Chloride: 107 mmol/L (ref 98–111)
Creatinine, Ser: 0.88 mg/dL (ref 0.44–1.00)
GFR, Estimated: >60 mL/min (ref >60)
Glucose, Bld: 108 mg/dL — ABNORMAL HIGH (ref 70–99)
Potassium: 4.1 mmol/L (ref 3.5–5.1)
Sodium: 136 mmol/L (ref 135–145)
Total Bilirubin: 0.5 mg/dL (ref 0.3–1.2)
Total Protein: 8.1 g/dL (ref 6.5–8.1)

## 2021-02-24 LAB — POC URINE PREG, ED: Preg Test, Ur: NEGATIVE

## 2021-02-24 LAB — LIPASE, BLOOD: Lipase: 34 U/L (ref 11–51)

## 2021-02-24 LAB — RESP PANEL BY RT-PCR (FLU A&B, COVID) ARPGX2
Influenza A by PCR: NEGATIVE
Influenza B by PCR: NEGATIVE
SARS Coronavirus 2 by RT PCR: NEGATIVE

## 2021-02-24 SURGERY — CHOLECYSTECTOMY, ROBOT-ASSISTED, LAPAROSCOPIC
Anesthesia: General | Site: Abdomen

## 2021-02-24 MED ORDER — 0.9 % SODIUM CHLORIDE (POUR BTL) OPTIME
TOPICAL | Status: DC | PRN
  Administered 2021-02-24: 500 mL

## 2021-02-24 MED ORDER — FENTANYL CITRATE (PF) 100 MCG/2ML IJ SOLN
INTRAMUSCULAR | Status: AC
  Administered 2021-02-24: 25 ug via INTRAVENOUS
  Filled 2021-02-24: qty 2

## 2021-02-24 MED ORDER — ENOXAPARIN SODIUM 60 MG/0.6ML IJ SOSY
0.5000 mg/kg | PREFILLED_SYRINGE | INTRAMUSCULAR | Status: DC
  Administered 2021-02-24: 52.5 mg via SUBCUTANEOUS
  Filled 2021-02-24: qty 0.6

## 2021-02-24 MED ORDER — RIZATRIPTAN BENZOATE 10 MG PO TBDP: 10.0000 mg | ORAL_TABLET | ORAL | Status: DC | PRN

## 2021-02-24 MED ORDER — ACETAMINOPHEN 325 MG PO TABS: 650.0000 mg | ORAL_TABLET | Freq: Four times a day (QID) | ORAL | Status: DC | PRN

## 2021-02-24 MED ORDER — FENTANYL CITRATE (PF) 100 MCG/2ML IJ SOLN
INTRAMUSCULAR | Status: AC
  Filled 2021-02-24: qty 2

## 2021-02-24 MED ORDER — BUPIVACAINE-EPINEPHRINE (PF) 0.25% -1:200000 IJ SOLN
INTRAMUSCULAR | Status: AC
  Filled 2021-02-24: qty 30

## 2021-02-24 MED ORDER — LACTATED RINGERS IV SOLN: INTRAVENOUS | Status: DC | PRN

## 2021-02-24 MED ORDER — LIDOCAINE HCL (CARDIAC) PF 100 MG/5ML IV SOSY
PREFILLED_SYRINGE | INTRAVENOUS | Status: DC | PRN
  Administered 2021-02-24: 100 mg via INTRAVENOUS

## 2021-02-24 MED ORDER — MORPHINE SULFATE (PF) 4 MG/ML IV SOLN
4.0000 mg | Freq: Once | INTRAVENOUS | Status: AC
  Administered 2021-02-24: 4 mg via INTRAVENOUS
  Filled 2021-02-24: qty 1

## 2021-02-24 MED ORDER — ACETAMINOPHEN 650 MG RE SUPP
650.0000 mg | Freq: Four times a day (QID) | RECTAL | Status: DC | PRN
  Filled 2021-02-24: qty 1

## 2021-02-24 MED ORDER — FENTANYL CITRATE (PF) 100 MCG/2ML IJ SOLN
INTRAMUSCULAR | Status: DC | PRN
  Administered 2021-02-24: 100 ug via INTRAVENOUS
  Administered 2021-02-24 (×2): 50 ug via INTRAVENOUS

## 2021-02-24 MED ORDER — PROPOFOL 10 MG/ML IV BOLUS
INTRAVENOUS | Status: AC
  Filled 2021-02-24: qty 20

## 2021-02-24 MED ORDER — ONDANSETRON HCL 4 MG/2ML IJ SOLN
INTRAMUSCULAR | Status: DC | PRN
  Administered 2021-02-24: 4 mg via INTRAVENOUS

## 2021-02-24 MED ORDER — ONDANSETRON HCL 4 MG/2ML IJ SOLN: 4.0000 mg | Freq: Once | INTRAMUSCULAR | Status: DC | PRN

## 2021-02-24 MED ORDER — IOHEXOL 300 MG/ML  SOLN
100.0000 mL | Freq: Once | INTRAMUSCULAR | Status: AC | PRN
  Administered 2021-02-24: 100 mL via INTRAVENOUS
  Filled 2021-02-24: qty 100

## 2021-02-24 MED ORDER — MIDAZOLAM HCL 2 MG/2ML IJ SOLN
INTRAMUSCULAR | Status: DC | PRN
  Administered 2021-02-24: 2 mg via INTRAVENOUS

## 2021-02-24 MED ORDER — SUMATRIPTAN SUCCINATE 50 MG PO TABS
50.0000 mg | ORAL_TABLET | ORAL | Status: DC | PRN
  Filled 2021-02-24: qty 1

## 2021-02-24 MED ORDER — PROPRANOLOL HCL ER 120 MG PO CP24
120.0000 mg | ORAL_CAPSULE | Freq: Every day | ORAL | Status: DC
  Administered 2021-02-25: 120 mg via ORAL
  Filled 2021-02-24 (×2): qty 1

## 2021-02-24 MED ORDER — LABETALOL HCL 5 MG/ML IV SOLN
INTRAVENOUS | Status: DC | PRN
  Administered 2021-02-24: 5 mg via INTRAVENOUS

## 2021-02-24 MED ORDER — PIPERACILLIN-TAZOBACTAM 3.375 G IVPB
3.3750 g | Freq: Three times a day (TID) | INTRAVENOUS | Status: DC
  Administered 2021-02-24 – 2021-02-25 (×2): 3.375 g via INTRAVENOUS
  Filled 2021-02-24 (×2): qty 50

## 2021-02-24 MED ORDER — MORPHINE SULFATE (PF) 4 MG/ML IV SOLN: 4.0000 mg | INTRAVENOUS | Status: DC | PRN

## 2021-02-24 MED ORDER — PIPERACILLIN-TAZOBACTAM 3.375 G IVPB 30 MIN
3.3750 g | Freq: Once | INTRAVENOUS | Status: AC
  Administered 2021-02-24: 3.375 g via INTRAVENOUS
  Filled 2021-02-24: qty 50

## 2021-02-24 MED ORDER — SUGAMMADEX SODIUM 200 MG/2ML IV SOLN
INTRAVENOUS | Status: DC | PRN
  Administered 2021-02-24 (×2): 100 mg via INTRAVENOUS

## 2021-02-24 MED ORDER — FENTANYL CITRATE (PF) 100 MCG/2ML IJ SOLN
25.0000 ug | INTRAMUSCULAR | Status: DC | PRN
  Administered 2021-02-24 (×2): 25 ug via INTRAVENOUS
  Administered 2021-02-24: 50 ug via INTRAVENOUS

## 2021-02-24 MED ORDER — CHLORHEXIDINE GLUCONATE 0.12 % MT SOLN
OROMUCOSAL | Status: AC
  Administered 2021-02-24: 15 mL
  Filled 2021-02-24: qty 15

## 2021-02-24 MED ORDER — ONDANSETRON HCL 4 MG/2ML IJ SOLN
4.0000 mg | Freq: Four times a day (QID) | INTRAMUSCULAR | Status: DC | PRN
  Administered 2021-02-24: 4 mg via INTRAVENOUS
  Filled 2021-02-24: qty 2

## 2021-02-24 MED ORDER — OXYCODONE HCL 5 MG PO TABS: 5.0000 mg | ORAL_TABLET | Freq: Once | ORAL | Status: DC | PRN

## 2021-02-24 MED ORDER — DEXAMETHASONE SODIUM PHOSPHATE 10 MG/ML IJ SOLN
INTRAMUSCULAR | Status: DC | PRN
  Administered 2021-02-24: 8 mg via INTRAVENOUS

## 2021-02-24 MED ORDER — TOPIRAMATE 25 MG PO TABS
25.0000 mg | ORAL_TABLET | Freq: Every day | ORAL | Status: DC
  Filled 2021-02-24 (×2): qty 1

## 2021-02-24 MED ORDER — ONDANSETRON 4 MG PO TBDP: 4.0000 mg | ORAL_TABLET | Freq: Four times a day (QID) | ORAL | Status: DC | PRN

## 2021-02-24 MED ORDER — PANTOPRAZOLE SODIUM 40 MG IV SOLR
40.0000 mg | Freq: Every day | INTRAVENOUS | Status: DC
  Administered 2021-02-24: 40 mg via INTRAVENOUS
  Filled 2021-02-24: qty 40

## 2021-02-24 MED ORDER — INDOCYANINE GREEN 25 MG IV SOLR
1.2500 mg | Freq: Once | INTRAVENOUS | Status: AC
  Administered 2021-02-24: 1.25 mg via INTRAVENOUS
  Filled 2021-02-24: qty 0.5

## 2021-02-24 MED ORDER — ACETAMINOPHEN 10 MG/ML IV SOLN
INTRAVENOUS | Status: DC | PRN
  Administered 2021-02-24: 1000 mg via INTRAVENOUS

## 2021-02-24 MED ORDER — SODIUM CHLORIDE 0.9 % IV SOLN: INTRAVENOUS | Status: DC

## 2021-02-24 MED ORDER — PROPOFOL 10 MG/ML IV BOLUS
INTRAVENOUS | Status: DC | PRN
  Administered 2021-02-24: 200 mg via INTRAVENOUS

## 2021-02-24 MED ORDER — ONDANSETRON HCL 4 MG/2ML IJ SOLN
4.0000 mg | Freq: Once | INTRAMUSCULAR | Status: AC
Start: 2021-02-24 — End: 2021-02-24
  Administered 2021-02-24: 4 mg via INTRAVENOUS
  Filled 2021-02-24: qty 2

## 2021-02-24 MED ORDER — ACETAMINOPHEN 10 MG/ML IV SOLN
INTRAVENOUS | Status: AC
  Filled 2021-02-24: qty 100

## 2021-02-24 MED ORDER — PIPERACILLIN-TAZOBACTAM 3.375 G IVPB
INTRAVENOUS | Status: AC
  Filled 2021-02-24: qty 50

## 2021-02-24 MED ORDER — ROCURONIUM BROMIDE 100 MG/10ML IV SOLN
INTRAVENOUS | Status: DC | PRN
Start: 2021-02-24 — End: 2021-02-24
  Administered 2021-02-24 (×2): 10 mg via INTRAVENOUS
  Administered 2021-02-24: 50 mg via INTRAVENOUS

## 2021-02-24 MED ORDER — HYDROCODONE-ACETAMINOPHEN 5-325 MG PO TABS
1.0000 | ORAL_TABLET | ORAL | Status: DC | PRN
  Administered 2021-02-24 – 2021-02-25 (×3): 2 via ORAL
  Filled 2021-02-24 (×3): qty 2

## 2021-02-24 MED ORDER — AMLODIPINE BESYLATE 10 MG PO TABS
10.0000 mg | ORAL_TABLET | Freq: Every day | ORAL | Status: DC
  Administered 2021-02-24 – 2021-02-25 (×2): 10 mg via ORAL
  Filled 2021-02-24 (×2): qty 1

## 2021-02-24 MED ORDER — BUPIVACAINE-EPINEPHRINE 0.25% -1:200000 IJ SOLN
INTRAMUSCULAR | Status: DC | PRN
  Administered 2021-02-24: 30 mL

## 2021-02-24 MED ORDER — MIDAZOLAM HCL 2 MG/2ML IJ SOLN
INTRAMUSCULAR | Status: AC
  Filled 2021-02-24: qty 2

## 2021-02-24 MED ORDER — OXYCODONE HCL 5 MG/5ML PO SOLN
5.0000 mg | Freq: Once | ORAL | Status: DC | PRN
Start: 2021-02-24 — End: 2021-02-24

## 2021-02-24 MED ORDER — ACETAMINOPHEN 10 MG/ML IV SOLN: 1000.0000 mg | Freq: Once | INTRAVENOUS | Status: DC | PRN

## 2021-02-24 MED ORDER — PROPOFOL 500 MG/50ML IV EMUL
INTRAVENOUS | Status: DC | PRN
  Administered 2021-02-24: 20 ug/kg/min via INTRAVENOUS

## 2021-02-24 SURGICAL SUPPLY — 53 items
BAG INFUSER PRESSURE 100CC (MISCELLANEOUS) IMPLANT
BLADE SURG SZ11 CARB STEEL (BLADE) ×3 IMPLANT
CANNULA REDUC XI 12-8 STAPL (CANNULA) ×1
CANNULA REDUCER 12-8 DVNC XI (CANNULA) ×2 IMPLANT
CHLORAPREP W/TINT 26 (MISCELLANEOUS) ×3 IMPLANT
CLIP LIGATING HEMO O LOK GREEN (MISCELLANEOUS) ×3 IMPLANT
DECANTER SPIKE VIAL GLASS SM (MISCELLANEOUS) IMPLANT
DEFOGGER SCOPE WARMER CLEARIFY (MISCELLANEOUS) ×3 IMPLANT
DERMABOND ADVANCED (GAUZE/BANDAGES/DRESSINGS) ×1
DERMABOND ADVANCED .7 DNX12 (GAUZE/BANDAGES/DRESSINGS) ×2 IMPLANT
DRAPE ARM DVNC X/XI (DISPOSABLE) ×8 IMPLANT
DRAPE COLUMN DVNC XI (DISPOSABLE) ×2 IMPLANT
DRAPE DA VINCI XI ARM (DISPOSABLE) ×4
DRAPE DA VINCI XI COLUMN (DISPOSABLE) ×1
ELECT REM PT RETURN 9FT ADLT (ELECTROSURGICAL) ×3
ELECTRODE REM PT RTRN 9FT ADLT (ELECTROSURGICAL) ×2 IMPLANT
GAUZE 4X4 16PLY ~~LOC~~+RFID DBL (SPONGE) ×3 IMPLANT
GLOVE SURG ENC MOIS LTX SZ6.5 (GLOVE) ×6 IMPLANT
GLOVE SURG UNDER POLY LF SZ6.5 (GLOVE) ×6 IMPLANT
GOWN STRL REUS W/ TWL LRG LVL3 (GOWN DISPOSABLE) ×6 IMPLANT
GOWN STRL REUS W/TWL LRG LVL3 (GOWN DISPOSABLE) ×3
GRASPER SUT TROCAR 14GX15 (MISCELLANEOUS) ×3 IMPLANT
IRRIGATOR SUCT 8 DISP DVNC XI (IRRIGATION / IRRIGATOR) IMPLANT
IRRIGATOR SUCTION 8MM XI DISP (IRRIGATION / IRRIGATOR)
IV NS 1000ML (IV SOLUTION)
IV NS 1000ML BAXH (IV SOLUTION) IMPLANT
KIT PINK PAD W/HEAD ARE REST (MISCELLANEOUS) ×3 IMPLANT
KIT PINK PAD W/HEAD ARM REST (MISCELLANEOUS) ×2 IMPLANT
LABEL OR SOLS (LABEL) ×3 IMPLANT
MANIFOLD NEPTUNE II (INSTRUMENTS) IMPLANT
NEEDLE HYPO 22GX1.5 SAFETY (NEEDLE) ×3 IMPLANT
NEEDLE INSUFFLATION 14GA 120MM (NEEDLE) ×3 IMPLANT
NS IRRIG 500ML POUR BTL (IV SOLUTION) ×3 IMPLANT
OBTURATOR OPTICAL LONG 8 DVNC (TROCAR) ×2 IMPLANT
OBTURATOR OPTICAL LONG 8MM (TROCAR) ×1
OBTURATOR OPTICAL STANDARD 8MM (TROCAR) ×1
OBTURATOR OPTICAL STND 8 DVNC (TROCAR) ×2
OBTURATOR OPTICALSTD 8 DVNC (TROCAR) ×2 IMPLANT
PACK LAP CHOLECYSTECTOMY (MISCELLANEOUS) ×3 IMPLANT
POUCH SPECIMEN RETRIEVAL 10MM (ENDOMECHANICALS) ×3 IMPLANT
SEAL CANN UNIV 5-8 DVNC XI (MISCELLANEOUS) ×6 IMPLANT
SEAL XI 5MM-8MM UNIVERSAL (MISCELLANEOUS) ×3
SET TUBE SMOKE EVAC HIGH FLOW (TUBING) ×3 IMPLANT
SOLUTION ELECTROLUBE (MISCELLANEOUS) ×3 IMPLANT
SPONGE T-LAP 4X18 ~~LOC~~+RFID (SPONGE) IMPLANT
STAPLER CANNULA SEAL DVNC XI (STAPLE) ×2 IMPLANT
STAPLER CANNULA SEAL XI (STAPLE) ×1
SUT MNCRL 4-0 (SUTURE) ×1
SUT MNCRL 4-0 27XMFL (SUTURE) ×2
SUT VICRYL 0 AB UR-6 (SUTURE) ×3 IMPLANT
SUTURE MNCRL 4-0 27XMF (SUTURE) ×2 IMPLANT
SYSTEM WECK SHIELD CLOSURE (TROCAR) ×3 IMPLANT
WATER STERILE IRR 500ML POUR (IV SOLUTION) ×3 IMPLANT

## 2021-02-24 NOTE — Anesthesia Postprocedure Evaluation (Signed)
Anesthesia Post Note  Patient: Ellen Richardson  Procedure(s) Performed: XI ROBOTIC ASSISTED LAPAROSCOPIC CHOLECYSTECTOMY (Abdomen) INDOCYANINE GREEN FLUORESCENCE IMAGING (ICG)  Patient location during evaluation: PACU Anesthesia Type: General Level of consciousness: awake and alert Pain management: pain level controlled Vital Signs Assessment: post-procedure vital signs reviewed and stable Respiratory status: spontaneous breathing, nonlabored ventilation, respiratory function stable and patient connected to nasal cannula oxygen Cardiovascular status: blood pressure returned to baseline and stable Postop Assessment: no apparent nausea or vomiting Anesthetic complications: no   No notable events documented.   Last Vitals:  Vitals:   02/24/21 1853 02/24/21 2020  BP: (!) 147/91 (!) 145/97  Pulse: 79 90  Resp: 18 20  Temp: 36.9 C 36.8 C  SpO2: 100% 100%    Last Pain:  Vitals:   02/24/21 2020  TempSrc: Oral  PainSc:                  Felicita Gage

## 2021-02-24 NOTE — ED Triage Notes (Signed)
C/O mid abdominal pain since last night.  No C?O N/V

## 2021-02-24 NOTE — ED Notes (Signed)
Patient transported to CT 

## 2021-02-24 NOTE — Op Note (Signed)
Preoperative diagnosis: Acute cholecystitis  Postoperative diagnosis: Acute cholecystitis  Procedure: Robotic Assisted Laparoscopic Cholecystectomy.   Anesthesia: GETA   Surgeon: Dr. Hazle Quant  Wound Classification: Clean Contaminated  Indications: Patient is a 35 y.o. female developed right upper quadrant pain and on workup was found to have cholecystitis. Robotic Assisted Laparoscopic cholecystectomy was elected.  Findings: Severe pericholecystic edema Critical view of safety achieved Cystic duct and artery identified, ligated and divided Adequate hemostasis            Description of procedure: The patient was placed on the operating table in the supine position. General anesthesia was induced. A time-out was completed verifying correct patient, procedure, site, positioning, and implant(s) and/or special equipment prior to beginning this procedure. An orogastric tube was placed. The abdomen was prepped and draped in the usual sterile fashion.  An incision was made in a natural skin line below the umbilicus.  The fascia was elevated and the Veress needle inserted. Proper position was confirmed by aspiration and saline meniscus test.  The abdomen was insufflated with carbon dioxide to a pressure of 15 mmHg. The patient tolerated insufflation well. A 8-mm trocar was then inserted in optiview fashion.  The laparoscope was inserted and the abdomen inspected. No injuries from initial trocar placement were noted. Additional trocars were then inserted in the following locations: an 8-mm trocar in the left lateral abdomen, and another two 8-mm trocars to the right side of the abdomen 5 cm appart. The umbilical trocar was changed to a 12 mm trocar all under direct visualization. The abdomen was inspected and no abnormalities were found. The table was placed in the reverse Trendelenburg position with the right side up. The robotic arms were docked and target anatomy identified. Instrument  inserted under direct visualization.  Filmy adhesions between the gallbladder and omentum, duodenum and transverse colon were lysed with electrocautery. The dome of the gallbladder was grasped with a prograsp and retracted over the dome of the liver. The infundibulum was also grasped with an atraumatic grasper and retracted toward the right lower quadrant. This maneuver exposed Calot's triangle. The peritoneum overlying the gallbladder infundibulum was then incised and the cystic duct and cystic artery identified and circumferentially dissected. Critical view of safety reviewed before ligating any structure. Firefly images taken to visualize biliary ducts. The cystic duct and cystic artery were then doubly clipped and divided close to the gallbladder.  The gallbladder was then dissected from its peritoneal attachments by electrocautery. Hemostasis was checked and the gallbladder and contained stones were removed using an endoscopic retrieval bag. The gallbladder was passed off the table as a specimen. There was no evidence of bleeding from the gallbladder fossa or cystic artery or leakage of the bile from the cystic duct stump. Secondary trocars were removed under direct vision. No bleeding was noted. The robotic arms were undoked. The scope was withdrawn and the umbilical trocar removed. The abdomen was allowed to collapse. The fascia of the 89mm trocar sites was closed with figure-of-eight 0 vicryl sutures. The skin was closed with subcuticular sutures of 4-0 monocryl and topical skin adhesive. The orogastric tube was removed.  The patient tolerated the procedure well and was taken to the postanesthesia care unit in stable condition.   Specimen: Gallbladder  Complications: None  EBL: 10 mL

## 2021-02-24 NOTE — Anesthesia Preprocedure Evaluation (Signed)
Anesthesia Evaluation  Patient identified by MRN, date of birth, ID band Patient awake    Reviewed: Allergy & Precautions, H&P , NPO status , Patient's Chart, lab work & pertinent test results, reviewed documented beta blocker date and time   History of Anesthesia Complications Negative for: history of anesthetic complications  Airway Mallampati: II  TM Distance: >3 FB Neck ROM: full    Dental  (+) Dental Advidsory Given, Missing   Pulmonary neg pulmonary ROS, neg sleep apnea, neg COPD, Patient abstained from smoking.Not current smoker,    Pulmonary exam normal breath sounds clear to auscultation       Cardiovascular Exercise Tolerance: Good METShypertension, Pt. on medications (-) CAD and (-) Past MI (-) dysrhythmias  Rhythm:Regular Rate:Normal - Systolic murmurs    Neuro/Psych  Headaches, negative psych ROS   GI/Hepatic negative GI ROS, Neg liver ROS, neg GERD  ,  Endo/Other  neg diabetesMorbid obesity  Renal/GU negative Renal ROS  negative genitourinary   Musculoskeletal   Abdominal (+) + obese,   Peds  Hematology   Anesthesia Other Findings Past Medical History: 2008: Anemia     Comment:  h/o   Reproductive/Obstetrics negative OB ROS                             Anesthesia Physical  Anesthesia Plan  ASA: 3  Anesthesia Plan: General   Post-op Pain Management:    Induction: Intravenous  PONV Risk Score and Plan: 4 or greater and Ondansetron, Dexamethasone and Midazolam  Airway Management Planned: Oral ETT  Additional Equipment: None  Intra-op Plan:   Post-operative Plan: Extubation in OR  Informed Consent: I have reviewed the patients History and Physical, chart, labs and discussed the procedure including the risks, benefits and alternatives for the proposed anesthesia with the patient or authorized representative who has indicated his/her understanding and acceptance.      Dental Advisory Given  Plan Discussed with: Anesthesiologist, CRNA and Surgeon  Anesthesia Plan Comments: (Patient denies nausea or vomiting.  Discussed risks of anesthesia with patient, including PONV, sore throat, lip/dental damage. Rare risks discussed as well, such as cardiorespiratory and neurological sequelae, and allergic reactions. Patient understands. Patient informed about increased incidence of above perioperative risk due to high BMI. Patient understands. )        Anesthesia Quick Evaluation

## 2021-02-24 NOTE — Anesthesia Procedure Notes (Signed)
Procedure Name: Intubation Date/Time: 02/24/2021 2:19 PM Performed by: Henrietta Hoover, CRNA Pre-anesthesia Checklist: Patient identified, Emergency Drugs available, Suction available and Patient being monitored Patient Re-evaluated:Patient Re-evaluated prior to induction Oxygen Delivery Method: Circle system utilized Preoxygenation: Pre-oxygenation with 100% oxygen Induction Type: IV induction and Cricoid Pressure applied Ventilation: Mask ventilation without difficulty Laryngoscope Size: 3 and McGraph Grade View: Grade I Tube type: Oral Tube size: 6.5 mm Number of attempts: 1 Airway Equipment and Method: Stylet and Video-laryngoscopy Placement Confirmation: ETT inserted through vocal cords under direct vision, positive ETCO2 and breath sounds checked- equal and bilateral Secured at: 19 cm Tube secured with: Tape Dental Injury: Teeth and Oropharynx as per pre-operative assessment

## 2021-02-24 NOTE — ED Provider Notes (Signed)
Emergency Medicine Provider Triage Evaluation Note  Ellen Richardson , a 35 y.o. female  was evaluated in triage.  Pt complains of epigastric pain..  Review of Systems  Positive: Epigastric pain, achy, awoke out of sleep Negative: No vomiting, diarrhea, fever, chills  Physical Exam  Wt 105.6 kg   BMI 42.58 kg/m  Gen:   Awake, no distress   Resp:  Normal effort  MSK:   Moves extremities without difficulty  Other:  Epigastric and right upper quadrant tender to palpation  Medical Decision Making  Medically screening exam initiated at 8:17 AM.  Appropriate orders placed.  Velena Drum was informed that the remainder of the evaluation will be completed by another provider, this initial triage assessment does not replace that evaluation, and the importance of remaining in the ED until their evaluation is complete.  CT abdomen/pelvis along with labs ordered, did explain to patient that she may also need to get a ultrasound of the right upper quadrant but will start with CT.   Faythe Ghee, PA-C 02/24/21 1453    Chesley Noon, MD 02/24/21 [id number]

## 2021-02-24 NOTE — Progress Notes (Signed)
PHARMACIST - PHYSICIAN COMMUNICATION  CONCERNING:  Enoxaparin (Lovenox) for DVT Prophylaxis    RECOMMENDATION: Patient was prescribed enoxaprin 40mg  q24 hours for VTE prophylaxis.   Filed Weights   02/24/21 0811  Weight: 105.6 kg (232 lb 12.9 oz)    Body mass index is 42.58 kg/m.  Estimated Creatinine Clearance: 103.8 mL/min (by C-G formula based on SCr of 0.88 mg/dL).   Based on [facility] policy patient is candidate for enoxaparin 0.5mg /kg TBW SQ every 24 hours based on BMI being >30.  Patient is candidate for enoxaparin 30mg  every 24 hours based on CrCl <55ml/min or Weight <45kg  DESCRIPTION: Pharmacy has adjusted enoxaparin dose per [facility] policy.  Patient is now receiving enoxaparin 52.524 mg every 24 hours    31m, PharmD Clinical Pharmacist  02/24/2021 12:58 PM

## 2021-02-24 NOTE — ED Provider Notes (Signed)
[facility] Emergency Department Provider Note   ____________________________________________   Event Date/Time   First MD Initiated Contact with Patient 02/24/21 1011     (approximate)  I have reviewed the triage vital signs and the nursing notes.   HISTORY  Chief Complaint Abdominal Pain    HPI Ellen Richardson is a 35 y.o. female with past medical history of hypertension and migraines who presents to the ED complaining of abdominal pain.  Patient reports that she has been dealing with constant pain in the right side of her abdomen since about 12 AM last night.  She describes the pain as sharp, not exacerbated or alleviated by anything in particular.  She denies any associated nausea, vomiting, diarrhea, constipation, dysuria, flank pain, or fever.  She reports similar episodes of pain in the past, but never as severe as last night and she has never been evaluated for it before.  She denies any history of abdominal surgeries.        Past Medical History:  Diagnosis Date   Anemia 2008   h/o   Migraine    UTI (urinary tract infection)     Patient Active Problem List   Diagnosis Date Noted   Acute cholecystitis 02/24/2021   Migraine without aura and without status migrainosus, not intractable 01/17/2021   Essential hypertension 10/16/2020   Morbid obesity with BMI of 45.0-49.9, adult (HCC) 10/16/2020   Other hypersomnia 10/13/2020   OSA (obstructive sleep apnea) 10/13/2020   COVID-19 vaccination declined 09/08/2020   Bursitis of right shoulder 04/01/2017   Impingement syndrome of shoulder region 04/01/2017   Sterilization consult 09/14/2013    Past Surgical History:  Procedure Laterality Date   IUD REMOVAL  2018   SHOULDER ARTHROSCOPY WITH SUBACROMIAL DECOMPRESSION Right 04/26/2017   Procedure: SHOULDER ARTHROSCOPY WITH SUBACROMIAL DECOMPRESSION;  Surgeon: Juanell Fairly, MD;  Location: [facility] ORS;  Service: Orthopedics;  Laterality: Right;    SHOULDER SURGERY      Prior to Admission medications   Medication Sig Start Date End Date Taking? Authorizing Provider  amLODipine (NORVASC) 10 MG tablet Take 1 tablet (10 mg total) by mouth daily. 10/10/20   Sallyanne Kuster, NP  ondansetron (ZOFRAN-ODT) 4 MG disintegrating tablet Take 1-2 tablets (4-8 mg total) by mouth every 8 (eight) hours as needed. 01/16/21   Anson Fret, MD  propranolol ER (INDERAL LA) 120 MG 24 hr capsule Take 1 capsule (120 mg total) by mouth daily. 01/16/21   Anson Fret, MD  rizatriptan (MAXALT-MLT) 10 MG disintegrating tablet Take 1 tablet (10 mg total) by mouth as needed for migraine. May repeat in 2 hours if needed 01/16/21   Anson Fret, MD  SUMAtriptan (IMITREX) 50 MG tablet Take 1 tablet (50 mg total) by mouth every 2 (two) hours as needed for migraine. May repeat in 2 hours if headache persists or recurs. 07/26/20   McDonough, Salomon Fick, PA-C  topiramate (TOPAMAX) 25 MG tablet Take 1 tablet (25 mg total) by mouth at bedtime. 01/16/21   Anson Fret, MD  Vitamin D, Ergocalciferol, (DRISDOL) 1.25 MG (50000 UNIT) CAPS capsule Take 1 capsule (50,000 Units total) by mouth every 7 (seven) days. Patient not taking: No sig reported 10/26/20   Sallyanne Kuster, NP    Allergies Patient has no known allergies.  Family History  Problem Relation Age of Onset   Hypertension Father    Diabetes Father    Migraines Sister    Hypertension Sister     Social  History Social History   Tobacco Use   Smoking status: Never   Smokeless tobacco: Never  Vaping Use   Vaping Use: Never used  Substance Use Topics   Alcohol use: Yes    Comment: occ   Drug use: Not Currently    Types: Marijuana    Review of Systems  Constitutional: No fever/chills Eyes: No visual changes. ENT: No sore throat. Cardiovascular: Denies chest pain. Respiratory: Denies shortness of breath. Gastrointestinal: Positive for abdominal pain.  No nausea, no vomiting.  No diarrhea.   No constipation. Genitourinary: Negative for dysuria. Musculoskeletal: Negative for back pain. Skin: Negative for rash. Neurological: Negative for headaches, focal weakness or numbness.  ____________________________________________   PHYSICAL EXAM:  VITAL SIGNS: ED Triage Vitals  Enc Vitals Group     BP 02/24/21 0826 (!) 135/103     Pulse Rate 02/24/21 0826 77     Resp 02/24/21 0826 16     Temp 02/24/21 0826 98.3 F (36.8 C)     Temp Source 02/24/21 0826 Oral     SpO2 02/24/21 0826 98 %     Weight 02/24/21 0811 232 lb 12.9 oz (105.6 kg)     Height --      Head Circumference --      Peak Flow --      Pain Score 02/24/21 0811 8     Pain Loc --      Pain Edu? --      Excl. in GC? --     Constitutional: Alert and oriented. Eyes: Conjunctivae are normal. Head: Atraumatic. Nose: No congestion/rhinnorhea. Mouth/Throat: Mucous membranes are moist. Neck: Normal ROM Cardiovascular: Normal rate, regular rhythm. Grossly normal heart sounds.  2+ radial pulses bilaterally. Respiratory: Normal respiratory effort.  No retractions. Lungs CTAB. Gastrointestinal: Soft and tender to palpation in the right upper and right lower quadrants with no rebound or guarding. No distention. Genitourinary: deferred Musculoskeletal: No lower extremity tenderness nor edema. Neurologic:  Normal speech and language. No gross focal neurologic deficits are appreciated. Skin:  Skin is warm, dry and intact. No rash noted. Psychiatric: Mood and affect are normal. Speech and behavior are normal.  ____________________________________________   LABS (all labs ordered are listed, but only abnormal results are displayed)  Labs Reviewed  COMPREHENSIVE METABOLIC PANEL - Abnormal; Notable for the following components:      Result Value   Glucose, Bld 108 (*)    AST 13 (*)    All other components within normal limits  URINALYSIS, ROUTINE W REFLEX MICROSCOPIC - Abnormal; Notable for the following components:    Color, Urine STRAW (*)    APPearance CLEAR (*)    Specific Gravity, Urine 1.004 (*)    Hgb urine dipstick SMALL (*)    All other components within normal limits  RESP PANEL BY RT-PCR (FLU A&B, COVID) ARPGX2  LIPASE, BLOOD  CBC  HIV ANTIBODY (ROUTINE TESTING W REFLEX)  CREATININE, SERUM  POC URINE PREG, ED  POC URINE PREG, ED    PROCEDURES  Procedure(s) performed (including Critical Care):  Procedures   ____________________________________________   INITIAL IMPRESSION / ASSESSMENT AND PLAN / ED COURSE      35 year old female with past medical history of hypertension and migraines who presents to the ED complaining of constant pain in the right side of her abdomen since about 12:00 last night.  Pain is reproducible with palpation of both her right upper and lower quadrants, no rebound or guarding noted.  Initial labs are unremarkable, pregnancy testing  is negative and UA shows no signs of infection.  We will further assess with CT scan for appendicitis versus biliary pathology, treat symptomatically with IV morphine and Zofran.  CT scan shows gallbladder wall thickening and pericholecystic fluid concerning for cholecystitis.  We will further assess with right upper quadrant ultrasound, case discussed with general surgery who will evaluate the patient.  We will give dose of IV Zosyn for suspected cholecystitis.  Patient's pain is well controlled on reassessment.      ____________________________________________   FINAL CLINICAL IMPRESSION(S) / ED DIAGNOSES  Final diagnoses:  RUQ pain  Cholecystitis     ED Discharge Orders     None        Note:  This document was prepared using Dragon voice recognition software and may include unintentional dictation errors.    Chesley Noon, MD 02/24/21 1252

## 2021-02-24 NOTE — Transfer of Care (Signed)
Immediate Anesthesia Transfer of Care Note  Patient: Ellen Richardson  Procedure(s) Performed: XI ROBOTIC ASSISTED LAPAROSCOPIC CHOLECYSTECTOMY (Abdomen) INDOCYANINE GREEN FLUORESCENCE IMAGING (ICG)  Patient Location: PACU  Anesthesia Type:General  Level of Consciousness: awake  Airway & Oxygen Therapy: Patient Spontanous Breathing  Post-op Assessment: Report given to RN  Post vital signs: stable  Last Vitals:  Vitals Value Taken Time  BP 155/100 02/24/21 1630  Temp    Pulse 88 02/24/21 1632  Resp 21 02/24/21 1632  SpO2 100 % 02/24/21 1632  Vitals shown include unvalidated device data.  Last Pain:  Vitals:   02/24/21 1344  TempSrc: Temporal  PainSc: 7          Complications: No notable events documented.

## 2021-02-24 NOTE — ED Notes (Signed)
Pt to OR at this time. Report given to Clydie Braun.

## 2021-02-24 NOTE — ED Notes (Signed)
Surgeon at bedside at this time. 

## 2021-02-24 NOTE — H&P (Signed)
SURGICAL CONSULTATION NOTE   HISTORY OF PRESENT ILLNESS (HPI):  35 y.o. female presented to [facility] ED for evaluation of abdominal pain since last night. Patient reports pain woke her up from sleeping.  Pain localized to the right upper quadrant.  Pain did not radiate to the probably body.  Patient cannot identify any alleviating or aggravating factors.  Denies any fever or chills.  At the ED she was found with normal vital cell count and normal hemoglobin.  No significant electrolyte disturbance.  CT scan of the abdomen shows gallbladder with thickening with pericholecystic fluid.  I personally evaluated the images.  Surgery is consulted by Dr. Larinda Buttery in this context for evaluation and management of acute cholecystitis.  PAST MEDICAL HISTORY (PMH):  Past Medical History:  Diagnosis Date   Anemia 2008   h/o   Migraine    UTI (urinary tract infection)      PAST SURGICAL HISTORY (PSH):  Past Surgical History:  Procedure Laterality Date   IUD REMOVAL  2018   SHOULDER ARTHROSCOPY WITH SUBACROMIAL DECOMPRESSION Right 04/26/2017   Procedure: SHOULDER ARTHROSCOPY WITH SUBACROMIAL DECOMPRESSION;  Surgeon: Juanell Fairly, MD;  Location: [facility] ORS;  Service: Orthopedics;  Laterality: Right;   SHOULDER SURGERY       MEDICATIONS:  Prior to Admission medications   Medication Sig Start Date End Date Taking? Authorizing Provider  amLODipine (NORVASC) 10 MG tablet Take 1 tablet (10 mg total) by mouth daily. 10/10/20   Sallyanne Kuster, NP  ondansetron (ZOFRAN-ODT) 4 MG disintegrating tablet Take 1-2 tablets (4-8 mg total) by mouth every 8 (eight) hours as needed. 01/16/21   Anson Fret, MD  propranolol ER (INDERAL LA) 120 MG 24 hr capsule Take 1 capsule (120 mg total) by mouth daily. 01/16/21   Anson Fret, MD  rizatriptan (MAXALT-MLT) 10 MG disintegrating tablet Take 1 tablet (10 mg total) by mouth as needed for migraine. May repeat in 2 hours if needed 01/16/21   Anson Fret, MD   SUMAtriptan (IMITREX) 50 MG tablet Take 1 tablet (50 mg total) by mouth every 2 (two) hours as needed for migraine. May repeat in 2 hours if headache persists or recurs. 07/26/20   McDonough, Salomon Fick, PA-C  topiramate (TOPAMAX) 25 MG tablet Take 1 tablet (25 mg total) by mouth at bedtime. 01/16/21   Anson Fret, MD  Vitamin D, Ergocalciferol, (DRISDOL) 1.25 MG (50000 UNIT) CAPS capsule Take 1 capsule (50,000 Units total) by mouth every 7 (seven) days. Patient not taking: No sig reported 10/26/20   Sallyanne Kuster, NP     ALLERGIES:  No Known Allergies   SOCIAL HISTORY:  Social History   Socioeconomic History   Marital status: Married    Spouse name: Not on file   Number of children: Not on file   Years of education: Not on file   Highest education level: Not on file  Occupational History   Not on file  Tobacco Use   Smoking status: Never   Smokeless tobacco: Never  Vaping Use   Vaping Use: Never used  Substance and Sexual Activity   Alcohol use: Yes    Comment: occ   Drug use: Not Currently    Types: Marijuana   Sexual activity: Not Currently    Partners: Male    Birth control/protection: Injection  Other Topics Concern   Not on file  Social History Narrative   ** Merged History Encounter **       Social Determinants of Health  Financial Resource Strain: Not on file  Food Insecurity: Not on file  Transportation Needs: Not on file  Physical Activity: Not on file  Stress: Not on file  Social Connections: Not on file  Intimate Partner Violence: Not At Risk   Fear of Current or Ex-Partner: No   Emotionally Abused: No   Physically Abused: No   Sexually Abused: No     FAMILY HISTORY:  Family History  Problem Relation Age of Onset   Hypertension Father    Diabetes Father    Migraines Sister    Hypertension Sister      REVIEW OF SYSTEMS:  Constitutional: denies weight loss, fever, chills, or sweats  Eyes: denies any other vision changes, history of eye  injury  ENT: denies sore throat, hearing problems  Respiratory: denies shortness of breath, wheezing  Cardiovascular: denies chest pain, palpitations  Gastrointestinal: positive abdominal pain, Denies nausea and vomitnig Genitourinary: denies burning with urination or urinary frequency Musculoskeletal: denies any other joint pains or cramps  Skin: denies any other rashes or skin discolorations  Neurological: denies any other headache, dizziness, weakness  Psychiatric: denies any other depression, anxiety   All other review of systems were negative   VITAL SIGNS:  Temp:  [98.3 F (36.8 C)] 98.3 F (36.8 C) (10/28 1136) Pulse Rate:  [68-77] 68 (10/28 1136) Resp:  [16] 16 (10/28 1136) BP: (135-139)/(95-103) 139/95 (10/28 1136) SpO2:  [98 %-99 %] 99 % (10/28 1136) Weight:  [105.6 kg] 105.6 kg (10/28 0811)       Weight: 105.6 kg     INTAKE/OUTPUT:  This shift: No intake/output data recorded.  Last 2 shifts: @IOLAST2SHIFTS @   PHYSICAL EXAM:  Constitutional:  -- Normal body habitus  -- Awake, alert, and oriented x3  Eyes:  -- Pupils equally round and reactive to light  -- No scleral icterus  Ear, nose, and throat:  -- No jugular venous distension  Pulmonary:  -- No crackles  -- Equal breath sounds bilaterally -- Breathing non-labored at rest Cardiovascular:  -- S1, S2 present  -- No pericardial rubs Gastrointestinal:  -- Abdomen soft, tender to palpation in right upper quadrant, non-distended, no guarding or rebound tenderness -- No abdominal masses appreciated, pulsatile or otherwise  Musculoskeletal and Integumentary:  -- Wounds: None appreciated -- Extremities: B/L UE and LE FROM, hands and feet warm, no edema  Neurologic:  -- Motor function: intact and symmetric -- Sensation: intact and symmetric   Labs:  CBC Latest Ref Rng & Units 02/24/2021 10/18/2020 10/18/2020  WBC 4.0 - 10.5 K/uL 7.2 4.6 4.0  Hemoglobin 12.0 - 15.0 g/dL 10/20/2020 11.1 73.5  Hematocrit 36.0 - 46.0  % 37.5 34.7 35.0  Platelets 150 - 400 K/uL 319 285 280   CMP Latest Ref Rng & Units 02/24/2021 10/18/2020 10/18/2020  Glucose 70 - 99 mg/dL 10/20/2020) 89 99  BUN 6 - 20 mg/dL 10 6 6   Creatinine 0.44 - 1.00 mg/dL 141(C 3.01  Sodium 135 - 145 mmol/L 136 138 139  Potassium 3.5 - 5.1 mmol/L 4.1 3.9 3.9  Chloride 98 - 111 mmol/L 107 102 103  CO2 22 - 32 mmol/L 24 20 20   Calcium 8.9 - 10.3 mg/dL 9.1 8.9 9.0  Total Protein 6.5 - 8.1 g/dL 8.1 7.0 7.0  Total Bilirubin 0.3 - 1.2 mg/dL 0.5 0.4 0.3  Alkaline Phos 38 - 126 U/L 49 55 58  AST 15 - 41 U/L 13(L) 8 8  ALT 0 - 44 U/L 10 6 7  Imaging studies:  EXAM: CT ABDOMEN AND PELVIS WITH CONTRAST   TECHNIQUE: Multidetector CT imaging of the abdomen and pelvis was performed using the standard protocol following bolus administration of intravenous contrast.   CONTRAST:  OMNIPAQUE IOHEXOL 300 MG/ML  SOLN   COMPARISON:  None.   FINDINGS: Lower chest: The lung bases are clear. The imaged heart is unremarkable.   Hepatobiliary: The liver is unremarkable.   The gallbladder mucosa is hyperemic with mild wall thickening and pericholecystic fluid. No calcified gallstones are seen. There is no biliary ductal dilatation.   Pancreas: Unremarkable.   Spleen: A few calcifications in the spleen likely reflect granulomas. The spleen is otherwise unremarkable.   Adrenals/Urinary Tract: The adrenals are unremarkable.   The kidneys are unremarkable, with no focal lesion, stone, hydronephrosis, or hydroureter. The bladder is unremarkable.   Stomach/Bowel: The stomach is unremarkable. There is no evidence of bowel obstruction. There is no abnormal bowel wall thickening or inflammatory change. The appendix is normal.   Vascular/Lymphatic: The abdominal aorta is normal in course and caliber. The major branch vessels are patent. The main portal and splenic veins are patent.   Reproductive: The uterus and adnexa are unremarkable. A tampon  is noted.   Other: There is no ascites or free air.   Musculoskeletal: There is no acute osseous abnormality or aggressive osseous lesion.   IMPRESSION: Findings above suspicious for acute cholecystitis. Correlate with physical exam, and consider HIDA scan or right upper quadrant ultrasound for further evaluation.     Electronically Signed   By: Lesia Hausen M.D.   On: 02/24/2021 11:58  Assessment/Plan:  35 y.o. female with acute cholecystitis, complicated by pertinent comorbidities including hypertension, morbid obesity.  Patient with history, physical exam and images consistent with acute cholecystitis. Patient oriented about diagnosis and surgical management as treatment.   Discussed the risk of surgery including post-op infxn, seroma, biloma, chronic pain, poor-delayed wound healing, retained gallstone, conversion to open procedure, post-op SBO or ileus, and need for additional procedures to address said risks.  The risks of general anesthetic including MI, CVA, sudden death or even reaction to anesthetic medications also discussed. Alternatives include continued observation.  Benefits include possible symptom relief, prevention of complications including acute cholecystitis, pancreatitis.  Gae Gallop, MD

## 2021-02-25 MED ORDER — HYDROCODONE-ACETAMINOPHEN 5-325 MG PO TABS: 1.0000 | ORAL_TABLET | ORAL | 0 refills | Status: AC | PRN

## 2021-02-25 NOTE — Discharge Instructions (Signed)

## 2021-02-25 NOTE — Progress Notes (Signed)
Mobility Specialist - Progress Note   02/25/21 1300  Mobility  Activity Ambulated in room  Level of Assistance Independent  Assistive Device None  Distance Ambulated (ft) 30 ft  Mobility Ambulated independently in room  Mobility Response Tolerated well  Mobility performed by Mobility specialist  $Mobility charge 1 Mobility    Pt ambulated in room independently. Mild pain, but no further complaints. Pt anticipating d/c.   Filiberto Pinks Mobility Specialist 02/25/21, 1:11 PM

## 2021-02-25 NOTE — Discharge Summary (Signed)
  Patient ID: Ellen Richardson MRN: [id number] DOB/AGE: 33-Jan-1987 33 y.o.  Admit date: 02/24/2021 Discharge date: 02/25/2021   Discharge Diagnoses:  Active Problems:   Acute cholecystitis   Procedures: Robotic assisted laparoscopic cholecystectomy  Hospital Course: Patient admitted with acute cholecystitis.  She underwent robotic assisted laparoscopic cholecystectomy.  She tolerated the procedure well.  This morning patient eating pancakes, scrambled eggs and bacon.  Patient tolerating diet well.  Wounds dry and clean.  Pain controlled.  Physical Exam HENT:     Head: Normocephalic.  Cardiovascular:     Rate and Rhythm: Normal rate and regular rhythm.  Pulmonary:     Effort: Pulmonary effort is normal.  Abdominal:     General: Abdomen is flat. Bowel sounds are normal.     Palpations: Abdomen is soft.  Skin:    General: Skin is warm.  Neurological:     General: No focal deficit present.     Mental Status: She is alert.     Consults: None  Disposition: Discharge disposition: 01-Home or Self Care       Discharge Instructions     Diet - low sodium heart healthy   Complete by: As directed    Increase activity slowly   Complete by: As directed       Allergies as of 02/25/2021   No Known Allergies      Medication List     TAKE these medications    amLODipine 10 MG tablet Commonly known as: NORVASC Take 1 tablet (10 mg total) by mouth daily.   HYDROcodone-acetaminophen 5-325 MG tablet Commonly known as: Norco Take 1 tablet by mouth every 4 (four) hours as needed for up to 3 days for moderate pain.   ondansetron 4 MG disintegrating tablet Commonly known as: ZOFRAN-ODT Take 1-2 tablets (4-8 mg total) by mouth every 8 (eight) hours as needed.   propranolol ER 120 MG 24 hr capsule Commonly known as: Inderal LA Take 1 capsule (120 mg total) by mouth daily.   rizatriptan 10 MG disintegrating tablet Commonly known as: MAXALT-MLT Take 1 tablet (10 mg total)  by mouth as needed for migraine. May repeat in 2 hours if needed   SUMAtriptan 50 MG tablet Commonly known as: Imitrex Take 1 tablet (50 mg total) by mouth every 2 (two) hours as needed for migraine. May repeat in 2 hours if headache persists or recurs.   topiramate 25 MG tablet Commonly known as: TOPAMAX Take 1 tablet (25 mg total) by mouth at bedtime.   Vitamin D (Ergocalciferol) 1.25 MG (50000 UNIT) Caps capsule Commonly known as: DRISDOL Take 1 capsule (50,000 Units total) by mouth every 7 (seven) days.        Follow-up Information     Carolan Shiver, MD Follow up in 2 week(s).   Specialty: General Surgery Contact information: [address] [place] [address] [phone]

## 2021-02-28 LAB — SURGICAL PATHOLOGY

## 2021-03-08 ENCOUNTER — Other Ambulatory Visit: Payer: Self-pay | Admitting: Physician Assistant

## 2021-03-08 ENCOUNTER — Other Ambulatory Visit: Payer: Self-pay | Admitting: Nurse Practitioner

## 2021-03-08 DIAGNOSIS — G43009 Migraine without aura, not intractable, without status migrainosus: Secondary | ICD-10-CM

## 2021-03-08 DIAGNOSIS — I10 Essential (primary) hypertension: Secondary | ICD-10-CM

## 2021-03-10 ENCOUNTER — Telehealth: Payer: Self-pay

## 2021-03-10 NOTE — Telephone Encounter (Signed)
Patient called to schedule appointment. I explained to her that she has been dismissed from our care. She asked for her medical records. I sent message to Trinna Post to see if she would get ready for me. I will call patient when ready to pick up-Toni

## 2021-03-14 ENCOUNTER — Other Ambulatory Visit: Payer: Self-pay

## 2021-03-14 ENCOUNTER — Emergency Department: Payer: 59

## 2021-03-14 ENCOUNTER — Emergency Department
Admission: EM | Admit: 2021-03-14 | Discharge: 2021-03-14 | Disposition: A | Discharge status: Home / Self Care | Payer: 59 | Attending: Emergency Medicine | Admitting: Emergency Medicine

## 2021-03-14 DIAGNOSIS — Z79899 Other long term (current) drug therapy: Secondary | ICD-10-CM | POA: Insufficient documentation

## 2021-03-14 DIAGNOSIS — I10 Essential (primary) hypertension: Secondary | ICD-10-CM | POA: Diagnosis not present

## 2021-03-14 DIAGNOSIS — R1013 Epigastric pain: Secondary | ICD-10-CM | POA: Insufficient documentation

## 2021-03-14 DIAGNOSIS — R1012 Left upper quadrant pain: Secondary | ICD-10-CM | POA: Diagnosis not present

## 2021-03-14 LAB — URINALYSIS, ROUTINE W REFLEX MICROSCOPIC
Bilirubin Urine: NEGATIVE
Glucose, UA: NEGATIVE mg/dL
Hgb urine dipstick: NEGATIVE
Ketones, ur: NEGATIVE mg/dL
Leukocytes,Ua: NEGATIVE
Nitrite: NEGATIVE
Protein, ur: NEGATIVE mg/dL
Specific Gravity, Urine: 1.021 (ref 1.005–1.030)
pH: 6 (ref 5.0–8.0)

## 2021-03-14 LAB — COMPREHENSIVE METABOLIC PANEL
ALT: 11 U/L (ref 0–44)
AST: 16 U/L (ref 15–41)
Albumin: 3.8 g/dL (ref 3.5–5.0)
Alkaline Phosphatase: 46 U/L (ref 38–126)
Anion gap: 9 (ref 5–15)
BUN: 13 mg/dL (ref 6–20)
CO2: 20 mmol/L — ABNORMAL LOW (ref 22–32)
Calcium: 9 mg/dL (ref 8.9–10.3)
Chloride: 109 mmol/L (ref 98–111)
Creatinine, Ser: 0.73 mg/dL (ref 0.44–1.00)
GFR, Estimated: >60 mL/min (ref >60)
Glucose, Bld: 112 mg/dL — ABNORMAL HIGH (ref 70–99)
Potassium: 3.9 mmol/L (ref 3.5–5.1)
Sodium: 138 mmol/L (ref 135–145)
Total Bilirubin: 0.4 mg/dL (ref 0.3–1.2)
Total Protein: 8 g/dL (ref 6.5–8.1)

## 2021-03-14 LAB — CBC
HCT: 35.7 % — ABNORMAL LOW (ref 36.0–46.0)
Hemoglobin: 11.8 g/dL — ABNORMAL LOW (ref 12.0–15.0)
MCH: 27.5 pg (ref 26.0–34.0)
MCHC: 33.1 g/dL (ref 30.0–36.0)
MCV: 83.2 fL (ref 80.0–100.0)
Platelets: 328 10*3/uL (ref 150–400)
RBC: 4.29 MIL/uL (ref 3.87–5.11)
RDW: 13.7 % (ref 11.5–15.5)
WBC: 6 10*3/uL (ref 4.0–10.5)
nRBC: 0 % (ref 0.0–0.2)

## 2021-03-14 LAB — LIPASE, BLOOD: Lipase: 34 U/L (ref 11–51)

## 2021-03-14 LAB — POC URINE PREG, ED: Preg Test, Ur: NEGATIVE

## 2021-03-14 MED ORDER — FAMOTIDINE 20 MG PO TABS: 20.0000 mg | ORAL_TABLET | Freq: Two times a day (BID) | ORAL | 0 refills | Status: DC

## 2021-03-14 MED ORDER — KETOROLAC TROMETHAMINE 30 MG/ML IJ SOLN
15.0000 mg | Freq: Once | INTRAMUSCULAR | Status: AC
  Administered 2021-03-14: 15 mg via INTRAVENOUS

## 2021-03-14 MED ORDER — KETOROLAC TROMETHAMINE 15 MG/ML IJ SOLN
15.0000 mg | Freq: Once | INTRAMUSCULAR | Status: DC
  Filled 2021-03-14: qty 1

## 2021-03-14 MED ORDER — IOHEXOL 300 MG/ML  SOLN
100.0000 mL | Freq: Once | INTRAMUSCULAR | Status: AC | PRN
  Administered 2021-03-14: 100 mL via INTRAVENOUS

## 2021-03-14 NOTE — ED Triage Notes (Signed)
Pt to ED for mid abd pain that started Saturday. Recent gallbladder removal. +nausea.   Pt in NAD, texting on phone in triage.  Ambulatory

## 2021-03-14 NOTE — ED Notes (Signed)
Pt in CT. Will give toradol when pt returns.

## 2021-03-14 NOTE — Discharge Instructions (Signed)
Your blood work was reassuring.  Your CAT scan did not show anything concerning in your abdomen that could be causing her pain.  Please follow-up with your surgeon as scheduled.

## 2021-03-14 NOTE — ED Notes (Signed)
Called pharmacy to send missing toradol. ED pyxis not loaded.

## 2021-03-14 NOTE — ED Notes (Signed)
ED provider at bedside with pt.

## 2021-03-14 NOTE — ED Provider Notes (Signed)
[facility]  ____________________________________________   Event Date/Time   First MD Initiated Contact with Patient 03/14/21 [phone]     (approximate)  I have reviewed the triage vital signs and the nursing notes.   HISTORY  Chief Complaint Abdominal Pain    HPI Ellen Richardson is a 35 y.o. female with past medical history of recent cholecystectomy 2 weeks ago, anemia, hypertension who presents with abdominal pain.  Symptoms started about 3 days ago.  Pain is located in epigastric region and left upper quadrant.  It is intermittent, sharp in quality.  He has associated nausea but no vomiting.  Denies diarrhea or constipation.  No fevers or chills.  Since having her gallbladder removed about 2 weeks ago she had otherwise been doing well at home.  Has tried Tylenol for it.  Denies urinary symptoms.         Past Medical History:  Diagnosis Date   Anemia 2008   h/o   Hypertension    Migraine    UTI (urinary tract infection)     Patient Active Problem List   Diagnosis Date Noted   Acute cholecystitis 02/24/2021   Migraine without aura and without status migrainosus, not intractable 01/17/2021   Essential hypertension 10/16/2020   Morbid obesity with BMI of 45.0-49.9, adult (HCC) 10/16/2020   Other hypersomnia 10/13/2020   OSA (obstructive sleep apnea) 10/13/2020   COVID-19 vaccination declined 09/08/2020   Bursitis of right shoulder 04/01/2017   Impingement syndrome of shoulder region 04/01/2017   Sterilization consult 09/14/2013    Past Surgical History:  Procedure Laterality Date   CHOLECYSTECTOMY     IUD REMOVAL  2018   SHOULDER ARTHROSCOPY WITH SUBACROMIAL DECOMPRESSION Right 04/26/2017   Procedure: SHOULDER ARTHROSCOPY WITH SUBACROMIAL DECOMPRESSION;  Surgeon: Juanell Fairly, MD;  Location: [facility] ORS;  Service: Orthopedics;  Laterality: Right;   SHOULDER SURGERY      Prior to Admission medications   Medication Sig Start Date End Date  Taking? Authorizing Provider  famotidine (PEPCID) 20 MG tablet Take 1 tablet (20 mg total) by mouth 2 (two) times daily. 03/14/21 04/13/21 Yes Georga Hacking, MD  amLODipine (NORVASC) 10 MG tablet Take 1 tablet (10 mg total) by mouth daily. 10/10/20   Sallyanne Kuster, NP  ondansetron (ZOFRAN-ODT) 4 MG disintegrating tablet Take 1-2 tablets (4-8 mg total) by mouth every 8 (eight) hours as needed. 01/16/21   Anson Fret, MD  propranolol ER (INDERAL LA) 120 MG 24 hr capsule Take 1 capsule (120 mg total) by mouth daily. 01/16/21   Anson Fret, MD  rizatriptan (MAXALT-MLT) 10 MG disintegrating tablet Take 1 tablet (10 mg total) by mouth as needed for migraine. May repeat in 2 hours if needed 01/16/21   Anson Fret, MD  SUMAtriptan (IMITREX) 50 MG tablet Take 1 tablet (50 mg total) by mouth every 2 (two) hours as needed for migraine. May repeat in 2 hours if headache persists or recurs. 07/26/20   McDonough, Salomon Fick, PA-C  topiramate (TOPAMAX) 25 MG tablet Take 1 tablet (25 mg total) by mouth at bedtime. 01/16/21   Anson Fret, MD  Vitamin D, Ergocalciferol, (DRISDOL) 1.25 MG (50000 UNIT) CAPS capsule Take 1 capsule (50,000 Units total) by mouth every 7 (seven) days. Patient not taking: No sig reported 10/26/20   Sallyanne Kuster, NP    Allergies Patient has no known allergies.  Family History  Problem Relation Age of Onset   Hypertension Father    Diabetes Father  Migraines Sister    Hypertension Sister     Social History Social History   Tobacco Use   Smoking status: Never   Smokeless tobacco: Never  Vaping Use   Vaping Use: Never used  Substance Use Topics   Alcohol use: Yes    Comment: occ   Drug use: Not Currently    Types: Marijuana    Review of Systems   Review of Systems  Constitutional:  Negative for appetite change, chills and fever.  Gastrointestinal:  Positive for abdominal distention. Negative for diarrhea and vomiting.  Genitourinary:  Negative  for dysuria.  All other systems reviewed and are negative.  Physical Exam Updated Vital Signs BP (!) 131/95   Pulse 80   Temp 98.1 F (36.7 C) (Oral)   Resp 14   Ht 5' (1.524 m)   Wt 99.8 kg   LMP 03/14/2021 Comment: neg preg test  SpO2 100%   BMI 42.97 kg/m   Physical Exam Vitals and nursing note reviewed.  Constitutional:      General: She is not in acute distress.    Appearance: Normal appearance.  HENT:     Head: Normocephalic and atraumatic.  Eyes:     General: No scleral icterus.    Conjunctiva/sclera: Conjunctivae normal.  Pulmonary:     Effort: Pulmonary effort is normal. No respiratory distress.     Breath sounds: No stridor.  Abdominal:     General: Abdomen is flat. A surgical scar is present. There is no distension.     Comments: Abdomen is soft, there is tenderness with voluntary guarding in the epigastric region and left upper quadrant  Musculoskeletal:        General: No deformity or signs of injury.     Cervical back: Normal range of motion.  Skin:    General: Skin is dry.     Coloration: Skin is not jaundiced or pale.  Neurological:     General: No focal deficit present.     Mental Status: She is alert and oriented to person, place, and time. Mental status is at baseline.  Psychiatric:        Mood and Affect: Mood normal.        Behavior: Behavior normal.     LABS (all labs ordered are listed, but only abnormal results are displayed)  Labs Reviewed  COMPREHENSIVE METABOLIC PANEL - Abnormal; Notable for the following components:      Result Value   CO2 20 (*)    Glucose, Bld 112 (*)    All other components within normal limits  CBC - Abnormal; Notable for the following components:   Hemoglobin 11.8 (*)    HCT 35.7 (*)    All other components within normal limits  URINALYSIS, ROUTINE W REFLEX MICROSCOPIC - Abnormal; Notable for the following components:   Color, Urine YELLOW (*)    APPearance CLEAR (*)    All other components within normal  limits  LIPASE, BLOOD  POC URINE PREG, ED   ____________________________________________  EKG  N/a ____________________________________________  RADIOLOGY Ky Barban, personally viewed and evaluated these images (plain radiographs) as part of my medical decision making, as well as reviewing the written report by the radiologist.  ED MD interpretation: I reviewed the CT of the abdomen pelvis with contrast which did not show any acute intra-abdominal process    ____________________________________________   PROCEDURES  Procedure(s) performed (including Critical Care):  Procedures   ____________________________________________   INITIAL IMPRESSION / ASSESSMENT AND PLAN /  ED COURSE     The patient is a 35 year old female presenting with several days of abdominal pain with associated nausea.  She did have a recent cholecystectomy about 2 weeks ago but had otherwise been doing well at home.  Vital signs within normal limits.  She is very well-appearing.  Abdomen is soft but there is tenderness with some voluntary guarding in the epigastric region and left upper quadrant but no right upper quadrant tenderness.  Her labs are all reassuring, no leukocytosis normal LFTs and lipase.  CT abdomen pelvis was obtained especially given she had recent surgery which does not show any acute intra-abdominal process.  Advised the patient to start taking Pepcid.  She is stable for discharge.      ____________________________________________   FINAL CLINICAL IMPRESSION(S) / ED DIAGNOSES  Final diagnoses:  Epigastric pain     ED Discharge Orders          Ordered    famotidine (PEPCID) 20 MG tablet  2 times daily        03/14/21 1137             Note:  This document was prepared using Dragon voice recognition software and may include unintentional dictation errors.    Georga Hacking, MD 03/14/21 1146

## 2021-03-14 NOTE — ED Notes (Signed)
Pt ambulatory to 1H bed.   Pt had cholecystectomy about 2 weeks ago and says ever since surgery, she has watery BM after each time she eats. States has sharp abdominal pain in LUQ but pain has moved around since it started and is sharp.  LBM 4 days ago.  Rates pain as 8/10 but appears NAD, texting on phone. Warm blankets provided.

## 2021-03-17 ENCOUNTER — Telehealth: Payer: Self-pay

## 2021-03-17 NOTE — Telephone Encounter (Signed)
Spoke to pt, she will call to see if her new PCP has Epic and if they don't she will call back and will need to sign the release

## 2021-03-17 NOTE — Telephone Encounter (Signed)
-----   Message from [place] sent at 03/14/2021  9:22 AM EST ----- Regarding: RE: Medical Records I don't see one for [place]. ----- Message ----- From: Jeannetta Ellis, CMA Sent: 03/13/2021   5:09 PM EST To: Karlton Lemon Subject: RE: Medical Records                            Has she signed a release? Her new PCP may not need a hard copy if they have Epic. I can call her to request for the release but if she has already done it then I don't want to bug her :) ----- Message ----- From: Karlton Lemon Sent: 03/10/2021   1:04 PM EST To: Jeannetta Ellis, CMA Subject: Medical Records                                Patient needs her medical records to take to new pcp. Can you get these done for her? Let me know when they are ready, and I will call to let her know.

## 2021-04-10 ENCOUNTER — Other Ambulatory Visit: Payer: Self-pay | Admitting: Nurse Practitioner

## 2021-04-10 DIAGNOSIS — I10 Essential (primary) hypertension: Secondary | ICD-10-CM

## 2021-04-10 NOTE — Telephone Encounter (Signed)
No longer seeing patient as their provider

## 2021-04-18 ENCOUNTER — Telehealth: Payer: 59 | Admitting: Adult Health

## 2021-04-20 ENCOUNTER — Encounter: Payer: Self-pay | Admitting: Internal Medicine

## 2021-05-03 ENCOUNTER — Other Ambulatory Visit: Payer: Self-pay | Admitting: Orthopedic Surgery

## 2021-05-03 DIAGNOSIS — G8929 Other chronic pain: Secondary | ICD-10-CM

## 2021-05-03 DIAGNOSIS — M25511 Pain in right shoulder: Secondary | ICD-10-CM

## 2021-05-25 ENCOUNTER — Ambulatory Visit
Admission: RE | Admit: 2021-05-25 | Discharge: 2021-05-25 | Disposition: A | Discharge status: Home / Self Care | Payer: 59 | Source: Ambulatory Visit | Attending: Orthopedic Surgery | Admitting: Orthopedic Surgery

## 2021-05-25 DIAGNOSIS — M25511 Pain in right shoulder: Secondary | ICD-10-CM | POA: Diagnosis present

## 2021-05-25 DIAGNOSIS — G8929 Other chronic pain: Secondary | ICD-10-CM | POA: Diagnosis present

## 2021-05-25 MED ORDER — IOHEXOL 180 MG/ML  SOLN
15.0000 mL | Freq: Once | INTRAMUSCULAR | Status: AC | PRN
  Administered 2021-05-25: 28 mL

## 2021-05-25 MED ORDER — LIDOCAINE HCL (PF) 1 % IJ SOLN
5.0000 mL | Freq: Once | INTRAMUSCULAR | Status: AC
  Administered 2021-05-25: 10 mL via INTRADERMAL
  Filled 2021-05-25: qty 5

## 2021-05-25 MED ORDER — GADOBUTROL 1 MMOL/ML IV SOLN
0.0500 mL | Freq: Once | INTRAVENOUS | Status: AC | PRN
  Administered 2021-05-25: 0.1 mL

## 2021-05-25 MED ORDER — SODIUM CHLORIDE (PF) 0.9 % IJ SOLN
5.0000 mL | Freq: Once | INTRAMUSCULAR | Status: AC
  Administered 2021-05-25: 12 mL

## 2021-06-08 ENCOUNTER — Telehealth: Payer: Self-pay | Admitting: Neurology

## 2021-06-08 ENCOUNTER — Other Ambulatory Visit: Payer: Self-pay | Admitting: Orthopedic Surgery

## 2021-06-08 NOTE — Telephone Encounter (Signed)
Spoke with [facility]. They have refills on file for all of those medications. It is one month too early to fill the Topamax and Propranolol, but the Zofran and maxalt they will fill today. I called the pt and updated her. Pt aware to call pharmacy when refills are needed and they will process for her and request from [place] if needed. She verbalized appreciation. Next appt with Dr Lucia Gaskins is 07/13/21 at 830 AM.

## 2021-06-08 NOTE — Telephone Encounter (Signed)
Pt is requesting a refill for topiramate (TOPAMAX) 25 MG tablet, propranolol ER (INDERAL LA) 120 MG 24 hr capsule, ondansetron (ZOFRAN-ODT) 4 MG disintegrating tablet & rizatriptan (MAXALT-MLT) 10 MG disintegrating tablet.  Pharmacy: [facility]

## 2021-06-14 ENCOUNTER — Ambulatory Visit (INDEPENDENT_AMBULATORY_CARE_PROVIDER_SITE_OTHER): Payer: 59 | Admitting: Neurology

## 2021-06-14 ENCOUNTER — Encounter: Payer: Self-pay | Admitting: Orthopedic Surgery

## 2021-06-14 ENCOUNTER — Telehealth: Payer: Self-pay

## 2021-06-14 ENCOUNTER — Encounter: Payer: Self-pay | Admitting: Neurology

## 2021-06-14 VITALS — BP 118/80 | HR 86 | Ht 60.0 in | Wt 236.4 lb

## 2021-06-14 DIAGNOSIS — G43009 Migraine without aura, not intractable, without status migrainosus: Secondary | ICD-10-CM

## 2021-06-14 DIAGNOSIS — Z6833 Body mass index (BMI) 33.0-33.9, adult: Secondary | ICD-10-CM

## 2021-06-14 DIAGNOSIS — E669 Obesity, unspecified: Secondary | ICD-10-CM | POA: Diagnosis not present

## 2021-06-14 MED ORDER — TOPIRAMATE 100 MG PO TABS: 100.0000 mg | ORAL_TABLET | Freq: Every evening | ORAL | 6 refills | Status: DC | PRN

## 2021-06-14 MED ORDER — UBRELVY 100 MG PO TABS
100.0000 mg | ORAL_TABLET | ORAL | 11 refills | Status: DC | PRN
Start: 2021-06-14 — End: 2024-01-21

## 2021-06-14 NOTE — Telephone Encounter (Signed)
I have submitted a PA request for Ubrelvy 100mg  on C[facility], Key: [facility] - PA Case ID: [facility].   Awaiting determination from [employer].

## 2021-06-14 NOTE — Progress Notes (Signed)
[facility]    Provider:  Dr Jaynee Eagles Requesting Provider: Ll[facility], [place] Provider:  [facility]  CC:  migraines  Follow up 06/14/2021: The topiramate helped but last 2 weeks the migraines have returned. She has tried Rizatroptan at onset and doesn't really help, same for imitrex. No side effects. Increase Topiramate to 50mg  then 100mg . Try Ubelvy. She has 6 migraine days a month, total 6 headache days a month.  HPI:  Ellen Richardson is a 36 y.o. female here as requested by [facility], [place] for migraines. Started about a year ago. She has family history in her sister with migraines. She was getting her migraines every day but they have significantly improved. Pulsating/pounding/throbbing, all over the head, can be unilateral, endorses nausea, also endorses light/sound sensitivity, they can last all day and be moderate to severe in pain. She has 10 migraine days a month.  No known etiology or inciting event.  Sumatriptan did not help a lot. She is on a lot of medications for pain right now, meloxicam, tramadol and Is also on propranolol and norvasc per patient, she said possibly the propranolol has helped a little bit.  We discussed all her options.  We will hold off in an MRI since her symptoms appear to be improving and there is no red flags such as no vision changes, not positional or exertional, and her neurologic exam is normal.  We discussed options, prior older oral medications, the newer CGRP oral and injectable medications.  She really does not know what triggers the headaches, or makes them worse.No other focal neurologic deficits, associated symptoms, inciting events or modifiable factors.  Reviewed notes, labs and imaging from outside physicians, which showed:  From a review of records and patient report medications tried that can be used in migraine and headache management include: Failed emgality (had samples) for 5 months  per patient, also propranolol which she has been on for at least 3 months does not seem to be helping a lot, she is also on amlodipine which she has been on for at least 3 months, Imitrex, starting topiramate, Maxalt.  Review of Systems: Patient complains of symptoms per HPI as well as the following symptoms: migraine . Pertinent negatives and positives per HPI. All others negative    Social History   Socioeconomic History   Marital status: Married    Spouse name: Not on file   Number of children: Not on file   Years of education: Not on file   Highest education level: Not on file  Occupational History   Not on file  Tobacco Use   Smoking status: Never   Smokeless tobacco: Never  Vaping Use   Vaping Use: Never used  Substance and Sexual Activity   Alcohol use: Yes    Comment: occ   Drug use: Not Currently    Types: Marijuana   Sexual activity: Not Currently    Partners: Male    Birth control/protection: Injection  Other Topics Concern   Not on file  Social History Narrative   ** Merged History Encounter **       Social Determinants of Health   Financial Resource Strain: Not on file  Food Insecurity: Not on file  Transportation Needs: Not on file  Physical Activity: Not on file  Stress: Not on file  Social Connections: Not on file  Intimate Partner Violence: Not At Risk   Fear of Current or Ex-Partner: No   Emotionally Abused:  No   Physically Abused: No   Sexually Abused: No    Family History  Problem Relation Age of Onset   Hypertension Father    Diabetes Father    Migraines Sister    Hypertension Sister     Past Medical History:  Diagnosis Date   Anemia 2008   h/o   Hypertension    Migraine    UTI (urinary tract infection)     Patient Active Problem List   Diagnosis Date Noted   Acute cholecystitis 02/24/2021   Migraine without aura and without status migrainosus, not intractable 01/17/2021   Essential hypertension 10/16/2020   Morbid obesity  with BMI of 45.0-49.9, adult ([place]) 10/16/2020   Other hypersomnia 10/13/2020   OSA (obstructive sleep apnea) 10/13/2020   COVID-19 vaccination declined 09/08/2020   Bursitis of right shoulder 04/01/2017   Impingement syndrome of shoulder region 04/01/2017   Sterilization consult 09/14/2013    Past Surgical History:  Procedure Laterality Date   CHOLECYSTECTOMY     IUD REMOVAL  2018   SHOULDER ARTHROSCOPY WITH SUBACROMIAL DECOMPRESSION Right 04/26/2017   Procedure: SHOULDER ARTHROSCOPY WITH SUBACROMIAL DECOMPRESSION;  Surgeon: Thornton Park, MD;  Location: [facility] ORS;  Service: Orthopedics;  Laterality: Right;   SHOULDER SURGERY      Current Outpatient Medications  Medication Sig Dispense Refill   amLODipine (NORVASC) 10 MG tablet Take 1 tablet (10 mg total) by mouth daily. 90 tablet 0   ondansetron (ZOFRAN-ODT) 4 MG disintegrating tablet Take 1-2 tablets (4-8 mg total) by mouth every 8 (eight) hours as needed. 30 tablet 3   propranolol ER (INDERAL LA) 120 MG 24 hr capsule Take 1 capsule (120 mg total) by mouth daily. 90 capsule 6   rizatriptan (MAXALT-MLT) 10 MG disintegrating tablet Take 1 tablet (10 mg total) by mouth as needed for migraine. May repeat in 2 hours if needed 9 tablet 11   topiramate (TOPAMAX) 100 MG tablet Take 1 tablet (100 mg total) by mouth at bedtime as needed. Start with 1/2 tablet (50mg ) for 1-2 weeks then increase to a whole tablet at bedtime (100mg ) 90 tablet 6   Ubrogepant (UBRELVY) 100 MG TABS Take 100 mg by mouth every 2 (two) hours as needed. Maximum 200mg  a day. 16 tablet 11   famotidine (PEPCID) 20 MG tablet Take 1 tablet (20 mg total) by mouth 2 (two) times daily. 60 tablet 0   Current Facility-Administered Medications  Medication Dose Route Frequency Provider Last Rate Last Admin   medroxyPROGESTERone (DEPO-PROVERA) injection 150 mg  150 mg Intramuscular Q90 days Junious Dresser, FNP        Allergies as of 06/14/2021   (No Known Allergies)     Vitals: BP 118/80    Pulse 86    Ht 5' (1.524 m)    Wt 236 lb 6.4 oz (107.2 kg)    BMI 46.17 kg/m  Last Weight:  Wt Readings from Last 1 Encounters:  06/14/21 236 lb 6.4 oz (107.2 kg)   Last Height:   Ht Readings from Last 1 Encounters:  06/14/21 5' (1.524 m)    Exam: NAD, pleasant                  Speech:    Speech is normal; fluent and spontaneous with normal comprehension.  Cognition:    The patient is oriented to person, place, and time;     recent and remote memory intact;     language fluent;    Cranial Nerves:  The pupils are equal, round, and reactive to light.Trigeminal sensation is intact and the muscles of mastication are normal. The face is symmetric. The palate elevates in the midline. Hearing intact. Voice is normal. Shoulder shrug is normal. The tongue has normal motion without fasciculations.   Coordination:  No dysmetria  Motor Observation:    No asymmetry, no atrophy, and no involuntary movements noted. Tone:    Normal muscle tone.     Strength:    Strength is V/V in the upper and lower limbs.      Sensation: intact to LT     Assessment/Plan: 36 year old patient with migraines.  She is tried and failed multiple medications in the past.  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  At this time we discussed all her options for acute and preventative. Consider MRI brain if not improving. Get eye exam.   The topiramate (low dose 25mg ) helped but last 2 weeks the migraines have returned. She has tried Rizatroptan at onset and doesn't really help, same for imitrex. No side effects to topiramate. Increase Topiramate to 50mg  then 100mg . Try Ubelvy. She has 6 migraine days a month, total 6 headache days a month.  Acute/emergency: Tresa Moore) and Ubrelvy right away. Please take one tablet (can be together) at the onset of your headache. If it does not  improve the symptoms please take one additional dose. Do not take more then 2 tablets in 24hrs. Do not take use more then 2 to 3 times in a week.  PREVENTION: Propranol: continue 120mg  at bedtime; This is a good migraine prevention medication and may help with your elevated BP  Increase  Topiramate at bedtime; This is a good migraine prevention medication and it also helps with weight  [facility].   If these medications do NOT help or you have side effects, mychart me and we can try Ajovy, Aimovig or Qulipta. Send me a Therapist, music.   At this time she declines an MRI of the brain.  At I do not see any red flags at this time, not exertional, not positional, appear to be stable as far as quality goes, but I did inform her if her migraines do not improve or she has new or worsening symptoms I would highly encourage an MRI of the brain.  Meds ordered this encounter  Medications   topiramate (TOPAMAX) 100 MG tablet    Sig: Take 1 tablet (100 mg total) by mouth at bedtime as needed. Start with 1/2 tablet (50mg ) for 1-2 weeks then increase to a whole tablet at bedtime (100mg )    Dispense:  90 tablet    Refill:  6   Ubrogepant (UBRELVY) 100 MG TABS    Sig: Take 100 mg by mouth every 2 (two) hours as needed. Maximum 200mg  a day.    Dispense:  16 tablet    Refill:  11     Cc: [facility], [place], [facility]  Sarina Ill, MD  [facility] [address] [place], [place] [address]  Phone [phone] Fax [phone]  I spent 50  minutes of face-to-face and non-face-to-face time with patient on the  1. Migraine without aura and without status migrainosus, not intractable   2. Class 1 obesity with body mass index (BMI) of 33.0 to 33.9 in adult, unspecified obesity type, unspecified whether  serious comorbidity present     diagnosis.  This included previsit chart review, lab review, study review, order entry,  electronic health record documentation, patient education on the different diagnostic and therapeutic options, counseling and coordination of care, risks and benefits of management, compliance, or risk factor reduction  I spent over 30 minutes of face-to-face and non-face-to-face time with patient on the  1. Migraine without aura and without status migrainosus, not intractable   2. Class 1 obesity with body mass index (BMI) of 33.0 to 33.9 in adult, unspecified obesity type, unspecified whether serious comorbidity present    diagnosis.  This included previsit chart review, lab review, study review, order entry, electronic health record documentation, patient education on the different diagnostic and therapeutic options, counseling and coordination of care, risks and benefits of management, compliance, or risk factor reduction

## 2021-06-14 NOTE — Patient Instructions (Addendum)
The topiramate (low dose 25mg ) helped but last 2 weeks the migraines have returned. She has tried Rizatroptan at onset and doesn't really help, same for imitrex. No side effects to topiramate. Increase Topiramate to 50mg  then 100mg . Try Ubelvy. She has 6 migraine days a month, total 6 headache days a month.  Acute/emergency: Tresa Moore) and Ubrelvy right away. Please take one tablet (can be together) at the onset of your headache. If it does not improve the symptoms please take one additional dose. Do not take more then 2 tablets in 24hrs. Do not take use more then 2 to 3 times in a week.  PREVENTION: Propranol: Increase to 120mg  at bedtime; This is a good migraine prevention medication and may help with your elevated BP  Increase  Topiramate at bedtime; This is a good migraine prevention medication and it also helps with weight  [facility].  Ubrogepant tablets What is this medication? UBROGEPANT (ue BROE je pant) is used to treat migraine headaches with or without aura. An aura is a strange feeling or visual disturbance that warns you of an attack. It is not used to prevent migraines. This medicine may be used for other purposes; ask your health care provider or pharmacist if you have questions. COMMON BRAND NAME(S): Roselyn Meier What should I tell my care team before I take this medication? They need to know if you have any of these conditions: kidney disease liver disease an unusual or allergic reaction to ubrogepant, other medicines, foods, dyes, or preservatives pregnant or trying to get pregnant breast-feeding How should I use this medication? Take this medicine by mouth with a glass of water. Follow the directions on the prescription label. You can take it with or without food. If it upsets your stomach, take it with food. Take your medicine at regular intervals. Do not take it more often than directed. Do not stop taking except on your doctor's advice. Talk  to your pediatrician about the use of this medicine in children. Special care may be needed. Overdosage: If you think you have taken too much of this medicine contact a poison control center or emergency room at once. NOTE: This medicine is only for you. Do not share this medicine with others. What if I miss a dose? This does not apply. This medicine is not for regular use. What may interact with this medication? Do not take this medicine with any of the following medicines: ceritinib certain antibiotics like chloramphenicol, clarithromycin, telithromycin certain antivirals for HIV like atazanavir, cobicistat, darunavir, delavirdine, fosamprenavir, indinavir, ritonavir certain medicines for fungal infections like itraconazole, ketoconazole, posaconazole, voriconazole conivaptan grapefruit idelalisib mifepristone nefazodone ribociclib This medicine may also interact with the following medications: carvedilol certain medicines for seizures like phenobarbital, phenytoin ciprofloxacin cyclosporine eltrombopag fluconazole fluvoxamine quinidine rifampin St. John's wort verapamil This list may not describe all possible interactions. Give your health care provider a list of all the medicines, herbs, non-prescription drugs, or dietary supplements you use. Also tell them if you smoke, drink alcohol, or use illegal drugs. Some items may interact with your medicine. What should I watch for while using this medication? Visit your health care professional for regular checks on your progress. Tell your health care professional if your symptoms do not start to get better or if they get worse. Your mouth may get dry. Chewing sugarless gum or sucking hard candy and drinking plenty of water may help. Contact your health care professional if the problem does not go away or is  severe. What side effects may I notice from receiving this medication? Side effects that you should report to your doctor or  health care professional as soon as possible: allergic reactions like skin rash, itching or hives; swelling of the face, lips, or tongue Side effects that usually do not require medical attention (report these to your doctor or health care professional if they continue or are bothersome): drowsiness dry mouth nausea tiredness This list may not describe all possible side effects. Call your doctor for medical advice about side effects. You may report side effects to FDA at 1-800-FDA-1088. Where should I keep my medication? Keep out of the reach of children. Store at room temperature between 15 and 30 degrees C (59 and 86 degrees F). Throw away any unused medicine after the expiration date. NOTE: This sheet is a summary. It may not cover all possible information. If you have questions about this medicine, talk to your doctor, pharmacist, or health care provider.  2022 Elsevier/Gold Standard (2018-07-03 00:00:00) Topiramate Tablets What is this medication? TOPIRAMATE (toe PYRE a mate) prevents and controls seizures in people with epilepsy. It may also be used to prevent migraine headaches. It works by calming overactive nerves in your body. This medicine may be used for other purposes; ask your health care provider or pharmacist if you have questions. COMMON BRAND NAME(S): Topamax, Topiragen What should I tell my care team before I take this medication? They need to know if you have any of these conditions: Bleeding disorder Kidney disease Lung disease Suicidal thoughts, plans or attempt An unusual or allergic reaction to topiramate, other medications, foods, dyes, or preservatives Pregnant or trying to get pregnant Breast-feeding How should I use this medication? Take this medication by mouth with water. Take it as directed on the prescription label at the same time every day. Do not cut, crush or chew this medicine. Swallow the tablets whole. You can take it with or without food. If it  upsets your stomach, take it with food. Keep taking it unless your care team tells you to stop. A special MedGuide will be given to you by the pharmacist with each prescription and refill. Be sure to read this information carefully each time. Talk to your care team about the use of this medication in children. While it may be prescribed for children as young as 2 years for selected conditions, precautions do apply. Overdosage: If you think you have taken too much of this medicine contact a poison control center or emergency room at once. NOTE: This medicine is only for you. Do not share this medicine with others. What if I miss a dose? If you miss a dose, take it as soon as you can unless it is within 6 hours of the next dose. If it is within 6 hours of the next dose, skip the missed dose. Take the next dose at the normal time. Do not take double or extra doses. What may interact with this medication? Acetazolamide Alcohol Antihistamines for allergy, cough, and cold Aspirin and aspirin-like medications Atropine Birth control pills Certain medications for anxiety or sleep Certain medications for bladder problems like oxybutynin, tolterodine Certain medications for depression like amitriptyline, fluoxetine, sertraline Certain medications for Parkinson's disease like benztropine, trihexyphenidyl Certain medications for seizures like carbamazepine, lamotrigine, phenobarbital, phenytoin, primidone, valproic acid, zonisamide Certain medications for stomach problems like dicyclomine, hyoscyamine Certain medications for travel sickness like scopolamine Certain medications that treat or prevent blood clots like warfarin, enoxaparin, dalteparin, apixaban, dabigatran, and rivaroxaban  Digoxin Diltiazem General anesthetics like halothane, isoflurane, methoxyflurane, propofol Glyburide Hydrochlorothiazide Ipratropium Lithium Medications that relax muscles Metformin Narcotic medications for  pain NSAIDs, medications for pain and inflammation, like ibuprofen or naproxen Phenothiazines like chlorpromazine, mesoridazine, prochlorperazine, thioridazine Pioglitazone This list may not describe all possible interactions. Give your health care provider a list of all the medicines, herbs, non-prescription drugs, or dietary supplements you use. Also tell them if you smoke, drink alcohol, or use illegal drugs. Some items may interact with your medicine. What should I watch for while using this medication? Visit your care team for regular checks on your progress. Tell your care team if your symptoms do not start to get better or if they get worse. Do not suddenly stop taking this medication. You may develop a severe reaction. Your care team will tell you how much medication to take. If your care team wants you to stop the medication, the dose may be slowly lowered over time to avoid any side effects. Wear a medical ID bracelet or chain. Carry a card that describes your condition. List the medications and doses you take on the card. You may get drowsy or dizzy. Do not drive, use machinery, or do anything that needs mental alertness until you know how this medication affects you. Do not stand up or sit up quickly, especially if you are an older patient. This reduces the risk of dizzy or fainting spells. Alcohol may interfere with the effects of this medication. Avoid alcoholic drinks. This medication may cause serious skin reactions. They can happen weeks to months after starting the medication. Contact your care team right away if you notice fevers or flu-like symptoms with a rash. The rash may be red or purple and then turn into blisters or peeling of the skin. Or, you might notice a red rash with swelling of the face, lips or lymph nodes in your neck or under your arms. Watch for new or worsening thoughts of suicide or depression. This includes sudden changes in mood, behaviors, or thoughts. These  changes can happen at any time but are more common in the beginning of treatment or after a change in dose. Call your care team right away if you experience these thoughts or worsening depression. This medication may slow your child's growth if it is taken for a long time at high doses. Your care team will monitor your child's growth. Using this medication for a long time may weaken your bones. The risk of bone fractures may be increased. Talk to your care team about your bone health. Do not become pregnant while taking this medication. Hormone forms of birth control may not work as well with this medication. Talk to your care team about other forms of birth control. There is potential for serious harm to an unborn child. Tell your care team right away if you think you might be pregnant. What side effects may I notice from receiving this medication? Side effects that you should report to your care team as soon as possible: Allergic reactions--skin rash, itching, hives, swelling of the face, lips, tongue, or throat High acid level--trouble breathing, unusual weakness or fatigue, confusion, headache, fast or irregular heartbeat, nausea, vomiting High ammonia level--unusual weakness or fatigue, confusion, loss of appetite, nausea, vomiting, seizures Fever that does not go away, decrease in sweat Kidney stones--blood in the urine, pain or trouble passing urine, pain in the lower back or sides Redness, blistering, peeling or loosening of the skin, including inside the mouth Sudden eye  pain or change in vision such as blurry vision, seeing halos around lights, vision loss Thoughts of suicide or self-harm, worsening mood, feelings of depression Side effects that usually do not require medical attention (report to your care team if they continue or are bothersome): Burning or tingling sensation in hands or feet Difficulty with paying attention, memory, or speech Dizziness Drowsiness Fatigue Loss of  appetite with weight loss Slow or sluggish movements of the body This list may not describe all possible side effects. Call your doctor for medical advice about side effects. You may report side effects to FDA at 1-800-FDA-1088. Where should I keep my medication? Keep out of the reach of children and pets. Store between 15 and 30 degrees C (59 and 86 degrees F). Protect from moisture. Keep the container tightly closed. Get rid of any unused medication after the expiration date. To get rid of medications that are no longer needed or have expired: Take the medication to a medication take-back program. Check with your pharmacy or law enforcement to find a location. If you cannot return the medication, check the label or package insert to see if the medication should be thrown out in the garbage or flushed down the toilet. If you are not sure, ask your care team. If it is safe to put it in the trash, empty the medication out of the container. Mix the medication with cat litter, dirt, coffee grounds, or other unwanted substance. Seal the mixture in a bag or container. Put it in the trash. NOTE: This sheet is a summary. It may not cover all possible information. If you have questions about this medicine, talk to your doctor, pharmacist, or health care provider.  2022 Elsevier/Gold Standard (2020-07-11 00:00:00)

## 2021-06-14 NOTE — Telephone Encounter (Signed)
Pt PA Vernie Ammons was approved . Approval Dates are 06/14/2021-06/14/2022   Will send mychart message to inform patient of approval

## 2021-06-21 ENCOUNTER — Other Ambulatory Visit
Admission: RE | Admit: 2021-06-21 | Discharge: 2021-06-21 | Disposition: A | Discharge status: Home / Self Care | Payer: 59 | Source: Ambulatory Visit | Attending: Orthopedic Surgery | Admitting: Orthopedic Surgery

## 2021-06-21 ENCOUNTER — Other Ambulatory Visit: Payer: Self-pay

## 2021-06-21 ENCOUNTER — Other Ambulatory Visit: Payer: Self-pay | Admitting: Orthopedic Surgery

## 2021-06-21 DIAGNOSIS — Z6841 Body Mass Index (BMI) 40.0 and over, adult: Secondary | ICD-10-CM

## 2021-06-21 DIAGNOSIS — Z01812 Encounter for preprocedural laboratory examination: Secondary | ICD-10-CM

## 2021-06-21 DIAGNOSIS — I10 Essential (primary) hypertension: Secondary | ICD-10-CM

## 2021-06-21 NOTE — Patient Instructions (Addendum)
Your procedure is scheduled on: 06/23/21 - Friday Report to the Registration Desk on the 1st floor of the [facility]. To find out your arrival time, please call [phone] between 1PM - 3PM on: 06/22/21 - Thursday  REMEMBER: Instructions that are not followed completely may result in serious medical risk, up to and including death; or upon the discretion of your surgeon and anesthesiologist your surgery may need to be rescheduled.  Do not eat food after midnight the night before surgery.  No gum chewing, lozengers or hard candies.  You may however, drink CLEAR liquids up to 2 hours before you are scheduled to arrive for your surgery. Do not drink anything within 2 hours of your scheduled arrival time.  Clear liquids include: - water  - apple juice without pulp - gatorade (not RED colors) - black coffee or tea (Do NOT add milk or creamers to the coffee or tea) Do NOT drink anything that is not on this list.  In addition, your doctor has ordered for you to drink the provided  Ensure Pre-Surgery Clear Carbohydrate Drink  Drinking this carbohydrate drink up to two hours before surgery helps to reduce insulin resistance and improve patient outcomes. Please complete drinking 2 hours prior to scheduled arrival time.  TAKE THESE MEDICATIONS THE MORNING OF SURGERY WITH A SIP OF WATER:  - amLODipine (NORVASC) 10 MG tablet - propranolol ER (INDERAL LA) 120 MG 24 hr capsule  One week prior to surgery: Stop Anti-inflammatories (NSAIDS) such as Advil, Aleve, Ibuprofen, Motrin, Naproxen, Naprosyn and Aspirin based products such as Excedrin, Goodys Powder, BC Powder.  Stop ANY OVER THE COUNTER supplements until after surgery.  You may however, continue to take Tylenol if needed for pain up until the day of surgery.  No Alcohol for 24 hours before or after surgery.  No Smoking including e-cigarettes for 24 hours prior to surgery.  No chewable tobacco products for at least 6 hours prior to  surgery.  No nicotine patches on the day of surgery.  Do not use any "recreational" drugs for at least a week prior to your surgery.  Please be advised that the combination of cocaine and anesthesia may have negative outcomes, up to and including death. If you test positive for cocaine, your surgery will be cancelled.  On the morning of surgery brush your teeth with toothpaste and water, you may rinse your mouth with mouthwash if you wish. Do not swallow any toothpaste or mouthwash.  Use CHG Soap or wipes as directed on instruction sheet.  Do not wear jewelry, make-up, hairpins, clips or nail polish.  Do not wear lotions, powders, or perfumes.   Do not shave body from the neck down 48 hours prior to surgery just in case you cut yourself which could leave a site for infection.  Also, freshly shaved skin may become irritated if using the CHG soap.  Contact lenses, hearing aids and dentures may not be worn into surgery.  Do not bring valuables to the hospital. [facility] is not responsible for any missing/lost belongings or valuables.   Notify your doctor if there is any change in your medical condition (cold, fever, infection).  Wear comfortable clothing (specific to your surgery type) to the hospital.  After surgery, you can help prevent lung complications by doing breathing exercises.  Take deep breaths and cough every 1-2 hours. Your doctor may order a device called an Incentive Spirometer to help you take deep breaths. When coughing or sneezing, hold a  pillow firmly against your incision with both hands. This is called splinting. Doing this helps protect your incision. It also decreases belly discomfort.  If you are being admitted to the hospital overnight, leave your suitcase in the car. After surgery it may be brought to your room.  If you are being discharged the day of surgery, you will not be allowed to drive home. You will need a responsible adult (18 years or older) to  drive you home and stay with you that night.   If you are taking public transportation, you will need to have a responsible adult (18 years or older) with you. Please confirm with your physician that it is acceptable to use public transportation.   Please call the Pre-admissions Testing Dept. at [phone] if you have any questions about these instructions.  Surgery Visitation Policy:  Patients undergoing a surgery or procedure may have one family member or support person with them as long as that person is not COVID-19 positive or experiencing its symptoms.  That person may remain in the waiting area during the procedure and may rotate out with other people.  Inpatient Visitation:    Visiting hours are 7 a.m. to 8 p.m. Up to two visitors ages 16+ are allowed at one time in a patient room. The visitors may rotate out with other people during the day. Visitors must check out when they leave, or other visitors will not be allowed. One designated support person may remain overnight. The visitor must pass COVID-19 screenings, use hand sanitizer when entering and exiting the patients room and wear a mask at all times, including in the patients room. Patients must also wear a mask when staff or their visitor are in the room. Masking is required regardless of vaccination status.

## 2021-06-22 ENCOUNTER — Encounter: Payer: Self-pay | Admitting: Urgent Care

## 2021-06-22 ENCOUNTER — Other Ambulatory Visit
Admission: RE | Admit: 2021-06-22 | Discharge: 2021-06-22 | Disposition: A | Discharge status: Home / Self Care | Payer: 59 | Source: Ambulatory Visit | Attending: Orthopedic Surgery | Admitting: Orthopedic Surgery

## 2021-06-22 DIAGNOSIS — Z01812 Encounter for preprocedural laboratory examination: Secondary | ICD-10-CM | POA: Insufficient documentation

## 2021-06-22 DIAGNOSIS — I10 Essential (primary) hypertension: Secondary | ICD-10-CM | POA: Insufficient documentation

## 2021-06-22 DIAGNOSIS — Z6841 Body Mass Index (BMI) 40.0 and over, adult: Secondary | ICD-10-CM | POA: Diagnosis not present

## 2021-06-22 LAB — BASIC METABOLIC PANEL
Anion gap: 6 (ref 5–15)
BUN: 11 mg/dL (ref 6–20)
CO2: 21 mmol/L — ABNORMAL LOW (ref 22–32)
Calcium: 8.6 mg/dL — ABNORMAL LOW (ref 8.9–10.3)
Chloride: 110 mmol/L (ref 98–111)
Creatinine, Ser: 0.88 mg/dL (ref 0.44–1.00)
GFR, Estimated: >60 mL/min (ref >60)
Glucose, Bld: 107 mg/dL — ABNORMAL HIGH (ref 70–99)
Potassium: 3.6 mmol/L (ref 3.5–5.1)
Sodium: 137 mmol/L (ref 135–145)

## 2021-06-22 LAB — CBC
HCT: 34.3 % — ABNORMAL LOW (ref 36.0–46.0)
Hemoglobin: 11.2 g/dL — ABNORMAL LOW (ref 12.0–15.0)
MCH: 26.7 pg (ref 26.0–34.0)
MCHC: 32.7 g/dL (ref 30.0–36.0)
MCV: 81.7 fL (ref 80.0–100.0)
Platelets: 282 10*3/uL (ref 150–400)
RBC: 4.2 MIL/uL (ref 3.87–5.11)
RDW: 14.7 % (ref 11.5–15.5)
WBC: 5.7 10*3/uL (ref 4.0–10.5)
nRBC: 0 % (ref 0.0–0.2)

## 2021-06-23 ENCOUNTER — Encounter: Payer: Self-pay | Admitting: Orthopedic Surgery

## 2021-06-23 ENCOUNTER — Other Ambulatory Visit: Payer: Self-pay

## 2021-06-23 ENCOUNTER — Ambulatory Visit: Payer: Self-pay | Admitting: Anesthesiology

## 2021-06-23 ENCOUNTER — Encounter
Admission: RE | Disposition: A | Discharge status: Home / Self Care | Payer: Self-pay | Source: Home / Self Care | Attending: Orthopedic Surgery

## 2021-06-23 ENCOUNTER — Encounter: Payer: Self-pay | Admitting: Anesthesiology

## 2021-06-23 ENCOUNTER — Ambulatory Visit: Payer: Self-pay

## 2021-06-23 ENCOUNTER — Ambulatory Visit
Admission: RE | Admit: 2021-06-23 | Discharge: 2021-06-23 | Disposition: A | Discharge status: Home / Self Care | Payer: 59 | Attending: Orthopedic Surgery | Admitting: Orthopedic Surgery

## 2021-06-23 DIAGNOSIS — S43492A Other sprain of left shoulder joint, initial encounter: Secondary | ICD-10-CM | POA: Insufficient documentation

## 2021-06-23 DIAGNOSIS — I10 Essential (primary) hypertension: Secondary | ICD-10-CM | POA: Insufficient documentation

## 2021-06-23 DIAGNOSIS — Z6841 Body Mass Index (BMI) 40.0 and over, adult: Secondary | ICD-10-CM | POA: Insufficient documentation

## 2021-06-23 DIAGNOSIS — M7552 Bursitis of left shoulder: Secondary | ICD-10-CM | POA: Insufficient documentation

## 2021-06-23 DIAGNOSIS — T413X5A Adverse effect of local anesthetics, initial encounter: Secondary | ICD-10-CM | POA: Diagnosis not present

## 2021-06-23 DIAGNOSIS — R0602 Shortness of breath: Secondary | ICD-10-CM | POA: Diagnosis not present

## 2021-06-23 DIAGNOSIS — M67814 Other specified disorders of tendon, left shoulder: Secondary | ICD-10-CM | POA: Diagnosis not present

## 2021-06-23 DIAGNOSIS — Z419 Encounter for procedure for purposes other than remedying health state, unspecified: Secondary | ICD-10-CM

## 2021-06-23 HISTORY — PX: SHOULDER ARTHROSCOPY WITH LABRAL REPAIR: SHX5691

## 2021-06-23 LAB — POCT PREGNANCY, URINE: Preg Test, Ur: NEGATIVE

## 2021-06-23 SURGERY — ARTHROSCOPY, SHOULDER, WITH GLENOID LABRUM REPAIR
Anesthesia: Regional | Site: Shoulder | Laterality: Right

## 2021-06-23 MED ORDER — LACTATED RINGERS IV SOLN: INTRAVENOUS | Status: DC

## 2021-06-23 MED ORDER — PHENYLEPHRINE HCL (PRESSORS) 10 MG/ML IV SOLN
INTRAVENOUS | Status: DC | PRN
  Administered 2021-06-23 (×4): 80 ug via INTRAVENOUS

## 2021-06-23 MED ORDER — ROCURONIUM BROMIDE 100 MG/10ML IV SOLN
INTRAVENOUS | Status: DC | PRN
  Administered 2021-06-23 (×2): 5 mg via INTRAVENOUS
  Administered 2021-06-23: 45 mg via INTRAVENOUS

## 2021-06-23 MED ORDER — MIDAZOLAM HCL 2 MG/2ML IJ SOLN: 1.0000 mg | INTRAMUSCULAR | Status: DC | PRN

## 2021-06-23 MED ORDER — ONDANSETRON HCL 4 MG/2ML IJ SOLN: 4.0000 mg | Freq: Once | INTRAMUSCULAR | Status: DC | PRN

## 2021-06-23 MED ORDER — BUPIVACAINE LIPOSOME 1.3 % IJ SUSP
INTRAMUSCULAR | Status: DC | PRN
  Administered 2021-06-23: 20 mL

## 2021-06-23 MED ORDER — CEFAZOLIN SODIUM-DEXTROSE 2-4 GM/100ML-% IV SOLN
INTRAVENOUS | Status: AC
  Filled 2021-06-23: qty 100

## 2021-06-23 MED ORDER — BUPIVACAINE LIPOSOME 1.3 % IJ SUSP
INTRAMUSCULAR | Status: AC
  Filled 2021-06-23: qty 20

## 2021-06-23 MED ORDER — ONDANSETRON HCL 4 MG/2ML IJ SOLN
INTRAMUSCULAR | Status: AC
  Filled 2021-06-23: qty 2

## 2021-06-23 MED ORDER — PHENYLEPHRINE HCL-NACL 20-0.9 MG/250ML-% IV SOLN
INTRAVENOUS | Status: AC
Start: 2021-06-23 — End: ?
  Filled 2021-06-23: qty 250

## 2021-06-23 MED ORDER — ACETAMINOPHEN 10 MG/ML IV SOLN: 1000.0000 mg | Freq: Once | INTRAVENOUS | Status: DC | PRN

## 2021-06-23 MED ORDER — RINGERS IRRIGATION IR SOLN
Status: DC | PRN
  Administered 2021-06-23: 6000 mL

## 2021-06-23 MED ORDER — PHENYLEPHRINE HCL (PRESSORS) 10 MG/ML IV SOLN
INTRAVENOUS | Status: AC
  Filled 2021-06-23: qty 1

## 2021-06-23 MED ORDER — DEXMEDETOMIDINE (PRECEDEX) IN NS 20 MCG/5ML (4 MCG/ML) IV SYRINGE
PREFILLED_SYRINGE | INTRAVENOUS | Status: DC | PRN
  Administered 2021-06-23: 8 ug via INTRAVENOUS
  Administered 2021-06-23: 12 ug via INTRAVENOUS

## 2021-06-23 MED ORDER — CEFAZOLIN SODIUM-DEXTROSE 2-4 GM/100ML-% IV SOLN
2.0000 g | INTRAVENOUS | Status: AC
Start: 2021-06-23 — End: 2021-06-23
  Administered 2021-06-23: 2 g via INTRAVENOUS

## 2021-06-23 MED ORDER — SUCCINYLCHOLINE CHLORIDE 200 MG/10ML IV SOSY
PREFILLED_SYRINGE | INTRAVENOUS | Status: AC
Start: 2021-06-23 — End: ?
  Filled 2021-06-23: qty 10

## 2021-06-23 MED ORDER — 0.9 % SODIUM CHLORIDE (POUR BTL) OPTIME
TOPICAL | Status: DC | PRN
  Administered 2021-06-23: 500 mL

## 2021-06-23 MED ORDER — GLYCOPYRROLATE 0.2 MG/ML IJ SOLN
INTRAMUSCULAR | Status: AC
  Filled 2021-06-23: qty 1

## 2021-06-23 MED ORDER — ASPIRIN EC 325 MG PO TBEC: 325.0000 mg | DELAYED_RELEASE_TABLET | Freq: Every day | ORAL | 0 refills | Status: AC

## 2021-06-23 MED ORDER — FENTANYL CITRATE PF 50 MCG/ML IJ SOSY
PREFILLED_SYRINGE | INTRAMUSCULAR | Status: AC
  Administered 2021-06-23: 50 ug via INTRAVENOUS
  Filled 2021-06-23: qty 1

## 2021-06-23 MED ORDER — BUPIVACAINE HCL (PF) 0.5 % IJ SOLN
INTRAMUSCULAR | Status: DC | PRN
  Administered 2021-06-23: 10 mL

## 2021-06-23 MED ORDER — ONDANSETRON HCL 4 MG/2ML IJ SOLN
INTRAMUSCULAR | Status: DC | PRN
  Administered 2021-06-23: 4 mg via INTRAVENOUS

## 2021-06-23 MED ORDER — CHLORHEXIDINE GLUCONATE 0.12 % MT SOLN
OROMUCOSAL | Status: AC
  Administered 2021-06-23: 15 mL via OROMUCOSAL
  Filled 2021-06-23: qty 15

## 2021-06-23 MED ORDER — MIDAZOLAM HCL 2 MG/2ML IJ SOLN
INTRAMUSCULAR | Status: AC
  Administered 2021-06-23: 2 mg via INTRAVENOUS
  Filled 2021-06-23: qty 2

## 2021-06-23 MED ORDER — ROCURONIUM BROMIDE 10 MG/ML (PF) SYRINGE
PREFILLED_SYRINGE | INTRAVENOUS | Status: AC
  Filled 2021-06-23: qty 10

## 2021-06-23 MED ORDER — PHENYLEPHRINE HCL-NACL 20-0.9 MG/250ML-% IV SOLN
INTRAVENOUS | Status: DC | PRN
  Administered 2021-06-23: 35 ug/min via INTRAVENOUS

## 2021-06-23 MED ORDER — FENTANYL CITRATE PF 50 MCG/ML IJ SOSY: 50.0000 ug | PREFILLED_SYRINGE | INTRAMUSCULAR | Status: DC | PRN

## 2021-06-23 MED ORDER — CEFAZOLIN SODIUM-DEXTROSE 2-4 GM/100ML-% IV SOLN: 2.0000 g | INTRAVENOUS | Status: DC

## 2021-06-23 MED ORDER — CHLORHEXIDINE GLUCONATE 0.12 % MT SOLN: 15.0000 mL | Freq: Once | OROMUCOSAL | Status: AC

## 2021-06-23 MED ORDER — DEXAMETHASONE SODIUM PHOSPHATE 10 MG/ML IJ SOLN
INTRAMUSCULAR | Status: DC | PRN
  Administered 2021-06-23: 10 mg via INTRAVENOUS

## 2021-06-23 MED ORDER — EPINEPHRINE PF 1 MG/ML IJ SOLN
INTRAMUSCULAR | Status: AC
  Filled 2021-06-23: qty 4

## 2021-06-23 MED ORDER — OXYCODONE HCL 5 MG PO TABS: 5.0000 mg | ORAL_TABLET | Freq: Once | ORAL | Status: DC | PRN

## 2021-06-23 MED ORDER — OXYCODONE HCL 5 MG/5ML PO SOLN: 5.0000 mg | Freq: Once | ORAL | Status: DC | PRN

## 2021-06-23 MED ORDER — BUPIVACAINE HCL (PF) 0.5 % IJ SOLN
INTRAMUSCULAR | Status: AC
  Filled 2021-06-23: qty 10

## 2021-06-23 MED ORDER — SUGAMMADEX SODIUM 200 MG/2ML IV SOLN
INTRAVENOUS | Status: DC | PRN
  Administered 2021-06-23: 214.4 mg via INTRAVENOUS

## 2021-06-23 MED ORDER — FENTANYL CITRATE (PF) 100 MCG/2ML IJ SOLN
INTRAMUSCULAR | Status: DC | PRN
  Administered 2021-06-23 (×2): 50 ug via INTRAVENOUS

## 2021-06-23 MED ORDER — SUCCINYLCHOLINE CHLORIDE 200 MG/10ML IV SOSY
PREFILLED_SYRINGE | INTRAVENOUS | Status: DC | PRN
  Administered 2021-06-23: 120 mg via INTRAVENOUS

## 2021-06-23 MED ORDER — FENTANYL CITRATE (PF) 100 MCG/2ML IJ SOLN: 25.0000 ug | INTRAMUSCULAR | Status: DC | PRN

## 2021-06-23 MED ORDER — LIDOCAINE HCL (PF) 2 % IJ SOLN
INTRAMUSCULAR | Status: AC
  Filled 2021-06-23: qty 5

## 2021-06-23 MED ORDER — ONDANSETRON 4 MG PO TBDP: 4.0000 mg | ORAL_TABLET | Freq: Three times a day (TID) | ORAL | 0 refills | Status: AC | PRN

## 2021-06-23 MED ORDER — PROPOFOL 10 MG/ML IV BOLUS
INTRAVENOUS | Status: AC
  Filled 2021-06-23: qty 40

## 2021-06-23 MED ORDER — ACETAMINOPHEN 10 MG/ML IV SOLN
INTRAVENOUS | Status: DC | PRN
Start: 2021-06-23 — End: 2021-06-23
  Administered 2021-06-23: 1000 mg via INTRAVENOUS

## 2021-06-23 MED ORDER — ACETAMINOPHEN 500 MG PO TABS: 1000.0000 mg | ORAL_TABLET | Freq: Three times a day (TID) | ORAL | 2 refills | Status: AC

## 2021-06-23 MED ORDER — LIDOCAINE HCL (PF) 1 % IJ SOLN
INTRAMUSCULAR | Status: AC
  Filled 2021-06-23: qty 30

## 2021-06-23 MED ORDER — GLYCOPYRROLATE 0.2 MG/ML IJ SOLN
INTRAMUSCULAR | Status: DC | PRN
  Administered 2021-06-23: .2 mg via INTRAVENOUS

## 2021-06-23 MED ORDER — FENTANYL CITRATE (PF) 100 MCG/2ML IJ SOLN
INTRAMUSCULAR | Status: AC
  Filled 2021-06-23: qty 2

## 2021-06-23 MED ORDER — LACTATED RINGERS IV SOLN
INTRAVENOUS | Status: DC | PRN
  Administered 2021-06-23: 12004 mL

## 2021-06-23 MED ORDER — DEXAMETHASONE SODIUM PHOSPHATE 10 MG/ML IJ SOLN
INTRAMUSCULAR | Status: AC
  Filled 2021-06-23: qty 1

## 2021-06-23 MED ORDER — ACETAMINOPHEN 10 MG/ML IV SOLN
INTRAVENOUS | Status: AC
  Filled 2021-06-23: qty 100

## 2021-06-23 MED ORDER — OXYCODONE HCL 5 MG PO TABS: 5.0000 mg | ORAL_TABLET | ORAL | 0 refills | Status: AC | PRN

## 2021-06-23 MED ORDER — EPHEDRINE SULFATE (PRESSORS) 50 MG/ML IJ SOLN
INTRAMUSCULAR | Status: DC | PRN
  Administered 2021-06-23 (×2): 5 mg via INTRAVENOUS

## 2021-06-23 MED ORDER — SEVOFLURANE IN SOLN
RESPIRATORY_TRACT | Status: AC
  Filled 2021-06-23: qty 250

## 2021-06-23 MED ORDER — ORAL CARE MOUTH RINSE: 15.0000 mL | Freq: Once | OROMUCOSAL | Status: AC

## 2021-06-23 MED ORDER — MIDAZOLAM HCL 2 MG/2ML IJ SOLN: 2.0000 mg | Freq: Once | INTRAMUSCULAR | Status: AC

## 2021-06-23 MED ORDER — FAMOTIDINE 20 MG PO TABS: 20.0000 mg | ORAL_TABLET | Freq: Once | ORAL | Status: DC

## 2021-06-23 MED ORDER — PROPOFOL 10 MG/ML IV BOLUS
INTRAVENOUS | Status: DC | PRN
  Administered 2021-06-23: 200 mg via INTRAVENOUS

## 2021-06-23 SURGICAL SUPPLY — 48 items
ANCH SUT SHRT 12.5 CANN EYLT (Anchor) ×1 IMPLANT
ANCHOR SUT 1.8 FBRTK KNTLS 2SU (Anchor) ×4 IMPLANT
ANCHOR SUT BIOCOMP LK 2.9X12.5 (Anchor) ×1 IMPLANT
APL PRP STRL LF DISP 70% ISPRP (MISCELLANEOUS) ×2
BLADE SHAVER 4.5X7 STR FR (MISCELLANEOUS) ×2 IMPLANT
CANNULA TWIST IN 8.25X7CM (CANNULA) ×1 IMPLANT
CHLORAPREP W/TINT 26 (MISCELLANEOUS) ×3 IMPLANT
COOLER POLAR GLACIER W/PUMP (MISCELLANEOUS) ×2 IMPLANT
DRAPE IMP U-DRAPE 54X76 (DRAPES) ×2 IMPLANT
DRAPE INCISE IOBAN 66X45 STRL (DRAPES) ×2 IMPLANT
DRAPE U-SHAPE 48X52 POLY STRL (PACKS) ×4 IMPLANT
DRSG TEGADERM 4X4.75 (GAUZE/BANDAGES/DRESSINGS) ×3 IMPLANT
ELECT REM PT RETURN 9FT ADLT (ELECTROSURGICAL) ×2
ELECTRODE REM PT RTRN 9FT ADLT (ELECTROSURGICAL) IMPLANT
GAUZE SPONGE 4X4 12PLY STRL (GAUZE/BANDAGES/DRESSINGS) ×2 IMPLANT
GAUZE XEROFORM 1X8 LF (GAUZE/BANDAGES/DRESSINGS) ×2 IMPLANT
GLOVE SRG 8 PF TXTR STRL LF DI (GLOVE) ×1 IMPLANT
GLOVE SURG ENC TEXT LTX SZ7.5 (GLOVE) ×2 IMPLANT
GLOVE SURG ORTHO LTX SZ8 (GLOVE) ×2 IMPLANT
GLOVE SURG UNDER POLY LF SZ8 (GLOVE) ×2
GOWN STRL REUS W/ TWL LRG LVL3 (GOWN DISPOSABLE) ×1 IMPLANT
GOWN STRL REUS W/ TWL XL LVL3 (GOWN DISPOSABLE) ×1 IMPLANT
GOWN STRL REUS W/TWL LRG LVL3 (GOWN DISPOSABLE) ×2
GOWN STRL REUS W/TWL XL LVL3 (GOWN DISPOSABLE) ×2
IV LACTATED RINGER IRRG 3000ML (IV SOLUTION) ×12
IV LR IRRIG 3000ML ARTHROMATIC (IV SOLUTION) ×6 IMPLANT
KIT CVD SPEAR FBRTK 1.8 DRILL (KITS) ×1 IMPLANT
KIT INSERTION 2.9 PUSHLOCK (KITS) ×1 IMPLANT
KIT STABILIZATION SHOULDER (MISCELLANEOUS) ×2 IMPLANT
KIT TURNOVER KIT A (KITS) ×2 IMPLANT
LASSO 25 DEG RIGHT QUICKPASS (SUTURE) ×1 IMPLANT
MANIFOLD NEPTUNE II (INSTRUMENTS) ×3 IMPLANT
MASK FACE SPIDER DISP (MASK) ×2 IMPLANT
MAT ABSORB  FLUID 56X50 GRAY (MISCELLANEOUS) ×2
MAT ABSORB FLUID 56X50 GRAY (MISCELLANEOUS) ×2 IMPLANT
PACK ARTHROSCOPY SHOULDER (MISCELLANEOUS) ×2 IMPLANT
PAD ABD DERMACEA PRESS 5X9 (GAUZE/BANDAGES/DRESSINGS) ×1 IMPLANT
PAD WRAPON POLAR SHDR XLG (MISCELLANEOUS) ×1 IMPLANT
PASSER SUT SWIFTSTITCH HIP CRT (INSTRUMENTS) ×1 IMPLANT
SLING ULTRA II LG (MISCELLANEOUS) ×1 IMPLANT
SPONGE T-LAP 18X18 ~~LOC~~+RFID (SPONGE) ×2 IMPLANT
SUT ETHILON 3-0 (SUTURE) ×1 IMPLANT
TAPE CLOTH 3X10 WHT NS LF (GAUZE/BANDAGES/DRESSINGS) ×1 IMPLANT
TAPE SUT LABRALTAP WHT/BLK (SUTURE) ×1 IMPLANT
TUBING INFLOW SET DBFLO PUMP (TUBING) ×2 IMPLANT
TUBING OUTFLOW SET DBLFO PUMP (TUBING) ×2 IMPLANT
WAND WEREWOLF FLOW 90D (MISCELLANEOUS) ×2 IMPLANT
WRAPON POLAR PAD SHDR XLG (MISCELLANEOUS) ×2
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
## 2021-06-23 NOTE — Anesthesia Postprocedure Evaluation (Signed)
Anesthesia Post Note  Patient: Ellen Richardson  Procedure(s) Performed: Right posterior labral repair with possible biceps tenodesis (Right: Shoulder)  Patient location during evaluation: PACU Anesthesia Type: Regional and General Level of consciousness: awake and alert, oriented and patient cooperative Pain management: pain level controlled Vital Signs Assessment: post-procedure vital signs reviewed and stable Respiratory status: spontaneous breathing, nonlabored ventilation and respiratory function stable Cardiovascular status: blood pressure returned to baseline and stable Postop Assessment: adequate PO intake Anesthetic complications: no   No notable events documented.   Last Vitals:  Vitals:   06/23/21 1100 06/23/21 1122  BP: 101/74 102/73  Pulse: 91 85  Resp: 20 16  Temp: (!) 36.1 C (!) 36.3 C  SpO2: 96% 100%    Last Pain:  Vitals:   06/23/21 1122  TempSrc: Temporal  PainSc: 0-No pain                 Darrin Nipper

## 2021-06-23 NOTE — H&P (Signed)
Paper H&P to be scanned into permanent record. H&P reviewed. No significant changes noted.  

## 2021-06-23 NOTE — Transfer of Care (Signed)
Immediate Anesthesia Transfer of Care Note  Patient: Ellen Richardson  Procedure(s) Performed: Right posterior labral repair with possible biceps tenodesis (Right: Shoulder)  Patient Location: PACU  Anesthesia Type:General  Level of Consciousness: awake and alert   Airway & Oxygen Therapy: Patient connected to face mask oxygen Post-op Assessment: Report given to RN and Post -op Vital signs reviewed and stable  Post vital signs: Reviewed and stable  Last Vitals:  Vitals Value Taken Time  BP 115/56 06/23/21 1031  Temp 35.7 C 06/23/21 1030  Pulse 98 06/23/21 1034  Resp 28 06/23/21 1034  SpO2 100 % 06/23/21 1034  Vitals shown include unvalidated device data.  Last Pain:  Vitals:   06/23/21 1030  TempSrc:   PainSc: Asleep         Complications: No notable events documented.

## 2021-06-23 NOTE — Anesthesia Preprocedure Evaluation (Addendum)
Anesthesia Evaluation  Patient identified by MRN, date of birth, ID band Patient awake    Reviewed: Allergy & Precautions, NPO status , Patient's Chart, lab work & pertinent test results  History of Anesthesia Complications Negative for: history of anesthetic complications  Airway Mallampati: II   Neck ROM: Full    Dental   Missing few molars:   Pulmonary neg pulmonary ROS,    Pulmonary exam normal breath sounds clear to auscultation       Cardiovascular hypertension, Normal cardiovascular exam Rhythm:Regular Rate:Normal  ECG 03/15/21: Normal sinus rhythm Minimal voltage criteria for LVH, may be normal variant ( R in aVL ) Cannot rule out Anterior infarct , age undetermined T wave abnormality, consider inferior ischemia Unchanged from prior ECG 11/17/18   Neuro/Psych  Headaches,    GI/Hepatic negative GI ROS,   Endo/Other  Class 3 obesity  Renal/GU negative Renal ROS     Musculoskeletal   Abdominal   Peds  Hematology  (+) Blood dyscrasia, anemia ,   Anesthesia Other Findings   Reproductive/Obstetrics                            Anesthesia Physical Anesthesia Plan  ASA: 3  Anesthesia Plan: General and Regional   Post-op Pain Management: Regional block*   Induction: Intravenous  PONV Risk Score and Plan: 3 and Ondansetron, Dexamethasone and Treatment may vary due to age or medical condition  Airway Management Planned: Oral ETT  Additional Equipment:   Intra-op Plan:   Post-operative Plan: Extubation in OR  Informed Consent: I have reviewed the patients History and Physical, chart, labs and discussed the procedure including the risks, benefits and alternatives for the proposed anesthesia with the patient or authorized representative who has indicated his/her understanding and acceptance.     Dental advisory given  Plan Discussed with: CRNA  Anesthesia Plan Comments: (Plan  for preoperative interscalene nerve block and GETA.  Patient consented for risks of anesthesia including but not limited to:  - adverse reactions to medications - damage to eyes, teeth, lips or other oral mucosa - nerve damage due to positioning  - sore throat or hoarseness - damage to heart, brain, nerves, lungs, other parts of body or loss of life  Informed patient about role of CRNA in peri- and intra-operative care.  Patient voiced understanding.)       Anesthesia Quick Evaluation

## 2021-06-23 NOTE — Anesthesia Procedure Notes (Signed)
Procedure Name: Intubation Date/Time: 06/23/2021 8:24 AM Performed by: Demetrius Charity, CRNA Pre-anesthesia Checklist: Patient identified, Patient being monitored, Timeout performed, Emergency Drugs available and Suction available Patient Re-evaluated:Patient Re-evaluated prior to induction Oxygen Delivery Method: Circle system utilized Preoxygenation: Pre-oxygenation with 100% oxygen Induction Type: IV induction Ventilation: Mask ventilation without difficulty Laryngoscope Size: 3 and McGraph Grade View: Grade I Tube type: Oral Tube size: 6.5 mm Number of attempts: 1 Airway Equipment and Method: Stylet and Video-laryngoscopy Placement Confirmation: ETT inserted through vocal cords under direct vision, positive ETCO2 and breath sounds checked- equal and bilateral Secured at: 19 cm Tube secured with: Tape Dental Injury: Teeth and Oropharynx as per pre-operative assessment

## 2021-06-23 NOTE — Discharge Instructions (Addendum)
Post-Op Instructions  1. Bracing: You will wear a shoulder immobilizer or sling for at least 3 weeks-4. Total time will be determined by type of surgical repair and can be discussed at 1st postop appointment. Keep arm in a neutral or externally rotated position using the pillow with the sling. Do NOT rest forearm against belly -- forearm shoulder be resting in a position pointing in front of the body.   2. Driving: No driving for 3 weeks post-op. When driving, do not wear the immobilizer.  3. Activity: No active lifting for 2 months. Wrist, hand, and elbow motion only. Avoid lifting the upper arm away from the body except for hygiene. You are permitted to bend and straighten the elbow actively. You may use your hand and wrist for typing, writing, and managing utensils (cutting food). Do not lift more than a coffee cup for 8 weeks.  When sleeping or resting, inclined positions (recliner chair or wedge pillow) and a pillow under the forearm for support may provide better comfort for up to 4 weeks.  Avoid long distance travel for 4 weeks.  Return to all activities after labral repair normally takes 6 months on average. If rehab goes very well, may be able to do most activities at 4 months, except overhead or contact sports.  4. Physical Therapy: Begins 3-4 days after surgery, and proceeds 2 times per week for the first 4 weeks, then 1-2 times per week from weeks 4-8 post-op.  5. Medications:  - You will be provided a prescription for narcotic pain medicine. After surgery, take 1-2 narcotic tablets every 4 hours if needed for severe pain.  - A prescription for anti-nausea medication will be provided in case the narcotic medicine causes nausea - take 1 tablet every 6 hours only if nauseated.   - Take tylenol 1000 mg every 8 hours for pain.  May stop tylenol 3 days after surgery if you are having minimal pain. - Take ASA 325mg /day x 2 weeks to help prevent DVT/PE (blood clots).   If you are taking  prescription medication for anxiety, depression, insomnia, muscle spasm, chronic pain, or for attention deficit disorder, you are advised that you are at a higher risk of adverse effects with use of narcotics post-op, including narcotic addiction/dependence, depressed breathing, death. If you use non-prescribed substances: alcohol, marijuana, cocaine, heroin, methamphetamines, etc., you are at a higher risk of adverse effects with use of narcotics post-op, including narcotic addiction/dependence, depressed breathing, death. You are advised that taking > 50 morphine milligram equivalents (MME) of narcotic pain medication per day results in twice the risk of overdose or death. For your prescription provided: oxycodone 5 mg - taking more than 6 tablets per day would result in > 50 morphine milligram equivalents (MME) of narcotic pain medication. Be advised that we will prescribe narcotics short-term, for acute post-operative pain only - 3 weeks for major operations such as shoulder repair/reconstruction surgeries.   6. Post-Op Appointment:  Your first post-op appointment will be 10-14 days post-op.  7. Work or School: For most, but not all procedures, we advise staying out of work or school for at least 1 to 2 weeks in order to recover from the stress of surgery and to allow time for healing.   If you need a work or school note this can be provided.   Post-operative Brace: Apply and remove the brace you received as you were instructed to at the time of fitting and as described in detail as the braces  instructions for use indicate.  Wear the brace for the period of time prescribed by your physician.  The brace can be cleaned with soap and water and allowed to air dry only.  Should the brace result in increased pain, decreased feeling (numbness/tingling), increased swelling or an overall worsening of your medical condition, please contact your doctor immediately.  If an emergency situation occurs as a  result of wearing the brace after normal business hours, please dial 911 and seek immediate medical attention.  Let your doctor know if you have any further questions about the brace issued to you. Refer to the shoulder sling instructions for use if you have any questions regarding the correct fit of your shoulder sling.  [facility] Customer Care for Troubleshooting: [phone]  Video that illustrates how to properly use a shoulder sling: "Instructions for Proper Use of an Orthopaedic Sling" http://bass.com/   AMBULATORY SURGERY  DISCHARGE INSTRUCTIONS   The drugs that you were given will stay in your system until tomorrow so for the next 24 hours you should not:  Drive an automobile Make any legal decisions Drink any alcoholic beverage   You may resume regular meals tomorrow.  Today it is better to start with liquids and gradually work up to solid foods.  You may eat anything you prefer, but it is better to start with liquids, then soup and crackers, and gradually work up to solid foods.   Please notify your doctor immediately if you have any unusual bleeding, trouble breathing, redness and pain at the surgery site, drainage, fever, or pain not relieved by medication.     Information for Discharge Teaching:  DO NOT TAKE TEAL EXPAREL BRACELET OFF FOR 4 DAYS (96 hours) - 06/27/2021 EXPAREL (bupivacaine liposome injectable suspension)   Your surgeon or anesthesiologist gave you EXPAREL(bupivacaine) to help control your pain after surgery.  EXPAREL is a local anesthetic that provides pain relief by numbing the tissue around the surgical site. EXPAREL is designed to release pain medication over time and can control pain for up to 72 hours. Depending on how you respond to EXPAREL, you may require less pain medication during your recovery.  Possible side effects: Temporary loss of sensation or ability to move in the area where bupivacaine was injected. Nausea,  vomiting, constipation Rarely, numbness and tingling in your mouth or lips, lightheadedness, or anxiety may occur. Call your doctor right away if you think you may be experiencing any of these sensations, or if you have other questions regarding possible side effects.  Follow all other discharge instructions given to you by your surgeon or nurse. Eat a healthy diet and drink plenty of water or other fluids.  If you return to the hospital for any reason within 96 hours following the administration of EXPAREL, it is important for health care providers to know that you have received this anesthetic. A teal colored band has been placed on your arm with the date, time and amount of EXPAREL you have received in order to alert and inform your health care providers. Please leave this armband in place for the full 96 hours following administration, and then you may remove the band.

## 2021-06-23 NOTE — Anesthesia Procedure Notes (Signed)
Anesthesia Regional Block: Interscalene brachial plexus block   Pre-Anesthetic Checklist: , timeout performed,  Correct Patient, Correct Site, Correct Laterality,  Correct Procedure,, risks and benefits discussed,  Surgical consent,  Pre-op evaluation,  At surgeon's request and post-op pain management  Laterality: Right  Prep: chloraprep       Needles:  Injection technique: Single-shot  Needle Type: Echogenic Needle          Additional Needles:   Procedures:,,,, ultrasound used (permanent image in chart),,   Motor weakness within 20 minutes.  Narrative:  Start time: 06/23/2021 8:01 AM End time: 06/23/2021 8:04 AM Injection made incrementally with aspirations every 5 mL.  Performed by: Personally  Anesthesiologist: Reed Breech, MD  Additional Notes: Functioning IV was confirmed and monitors applied.  Sterile prep and drape, hand hygiene and sterile gloves were used. Ultrasound guidance: relevant anatomy identified, needle position confirmed, local anesthetic spread visualized around nerve(s), vascular puncture avoided.  Image saved to electronic medical record.  Negative aspiration prior to incremental administration of local anesthetic for total 20 ml Exparel and 10 ml bupivacaine 0.5% given in interscalene distribution. The patient tolerated the procedure well. Vital signs and moderate sedation medications recorded in RN notes.

## 2021-06-26 ENCOUNTER — Other Ambulatory Visit: Payer: Self-pay

## 2021-06-26 ENCOUNTER — Encounter: Payer: Self-pay | Admitting: Orthopedic Surgery

## 2021-06-27 NOTE — Addendum Note (Signed)
Addendum  created 06/27/21 1040 by Reed Breech, MD   Clinical Note Signed

## 2021-06-27 NOTE — Progress Notes (Addendum)
Pt did admit to [facility] that is worse when talking, exerting self, and sleeping.  Encouraged her to call and talk to dr patel office because has been 4 days since her nerve block.  Pt verbalized understanding. Did not have anything at home to check HR/sats but reports at PT yesterday they checked and was WNL.  On phone pt is talking in complete sentences. Dr Erenest Rasher also notified so can f/u with pt if needed.

## 2021-06-27 NOTE — Progress Notes (Signed)
Informed by post-op nurse that patient was still experiencing shortness of breath on POD #4 s/p interscalene nerve block with Exparel.  I reassured patient that the shortness of breath is an expected side effect of a nerve block with Exparel and it should improve in the next day or two.  I suggested, however, that she call her PCP to schedule an appointment as soon as possible to rule out any additional cause of the shortness of breath, ideally in the next day or two.  She expressed understanding and said she would do so.  All questions answered and concerns addressed.  I will personally continue to follow up with the patient to make sure the issue is resolved.  Darrin Nipper

## 2021-06-29 NOTE — Progress Notes (Signed)
Followed up with patient via phone on POD #6.  She reports improvement in shortness of breath.  She is still numb in her thumb.  I explained that the SOB and numbness will both continue to improve over the next few days.  She asked if she is able to drive and I told her to ask Dr. Allena Katz about this, but recommend against it while still in the sling.  Will continue to follow.  Reed Breech

## 2021-07-12 ENCOUNTER — Ambulatory Visit: Payer: 59 | Admitting: Neurology

## 2021-08-17 ENCOUNTER — Encounter: Payer: 59 | Admitting: Nurse Practitioner

## 2021-09-11 ENCOUNTER — Telehealth: Payer: Self-pay | Admitting: Neurology

## 2021-09-11 ENCOUNTER — Telehealth (INDEPENDENT_AMBULATORY_CARE_PROVIDER_SITE_OTHER): Payer: Self-pay | Admitting: Neurology

## 2021-09-11 ENCOUNTER — Encounter: Payer: Self-pay | Admitting: Neurology

## 2021-09-11 DIAGNOSIS — G43009 Migraine without aura, not intractable, without status migrainosus: Secondary | ICD-10-CM

## 2021-09-11 MED ORDER — QULIPTA 60 MG PO TABS: 60.0000 mg | ORAL_TABLET | Freq: Every day | ORAL | 11 refills | Status: DC

## 2021-09-11 NOTE — Progress Notes (Addendum)
[facility]    Provider:  Dr Lucia Gaskins Requesting Provider: [facility] Primary Care Provider:  [facility]  CC:  migraines  Virtual Visit via Telephone Note (Tried to connect via video but my speaker was not working, instead called)  I connected with Adilene Ruhlman on 09/11/21 at  1:30 PM EDT by telephone and verified that I am speaking with the correct person using two identifiers.  Location: Patient: office Provider: home   I discussed the limitations, risks, security and privacy concerns of performing an evaluation and management service by telephone and the availability of in person appointments. I also discussed with the patient that there may be a patient responsible charge related to this service. The patient expressed understanding and agreed to proceed.   Follow Up Instructions:    I discussed the assessment and treatment plan with the patient. The patient was provided an opportunity to ask questions and all were answered. The patient agreed with the plan and demonstrated an understanding of the instructions.   The patient was advised to call back or seek an in-person evaluation if the symptoms worsen or if the condition fails to improve as anticipated.  I provided over 22 minutes of non-face-to-face time during this encounter.   Anson Fret, MD   Follow-up Sep 11, 2021: Currently patient is on topiramate, propranolol, Ubrelvy, Zofran, Tylenol, sumatriptan and amlodipine which are all medications that can be used in migraine management.  At last appointment we spoke if she was not well controlled we could switch her to [place] or Ajovy. She is having 15 migraine days a month and 20total headaches. Tried [employer] Next would try Ajovy. Aimovig contraindicated due to constipation. 15 migraines a month, 20 total headache days a month, failed topiramate, propranolol, nortriptyline/amitroptyline.  Patient complains of symptoms  per HPI as well as the following symptoms: improved migraines . Pertinent negatives and positives per HPI. All others negative  Follow up 06/14/2021: The topiramate helped but last 2 weeks the migraines have returned. She has tried Rizatroptan at onset and doesn't really help, same for imitrex. No side effects. Increase Topiramate to  then . Try Ubelvy. She has 6 migraine days a month, total 6 headache days a month.  HPI:  Infinity Jeffords is a 36 y.o. female here as requested by [facility] for migraines. Started about a year ago. She has family history in her sister with migraines. She was getting her migraines every day but they have significantly improved. Pulsating/pounding/throbbing, all over the head, can be unilateral, endorses nausea, also endorses light/sound sensitivity, they can last all day and be moderate to severe in pain. She has 10 migraine days a month.  No known etiology or inciting event.  Sumatriptan did not help a lot. She is on a lot of medications for pain right now, meloxicam, tramadol and Is also on propranolol and norvasc per patient, she said possibly the propranolol has helped a little bit.  We discussed all her options.  We will hold off in an MRI since her symptoms appear to be improving and there is no red flags such as no vision changes, not positional or exertional, and her neurologic exam is normal.  We discussed options, prior older oral medications, the newer CGRP oral and injectable medications.  She really does not know what triggers the headaches, or makes them worse.No other focal neurologic deficits, associated symptoms, inciting events or modifiable factors.  Reviewed notes, labs and imaging from  outside physicians, which showed:  From a review of records and patient report medications tried that can be used in migraine and headache management include: Failed emgality (had samples) for 5 months per patient, also propranolol which she has been on  for at least 3 months does not seem to be helping a lot, she is also on amlodipine which she has been on for at least 3 months, Imitrex, starting topiramate, Maxalt.  Review of Systems: Patient complains of symptoms per HPI as well as the following symptoms: migraine . Pertinent negatives and positives per HPI. All others negative    Social History   Socioeconomic History   Marital status: Married    Spouse name: Antanio   Number of children: 2   Years of education: Not on file   Highest education level: Not on file  Occupational History   Not on file  Tobacco Use   Smoking status: Never   Smokeless tobacco: Never  Vaping Use   Vaping Use: Never used  Substance and Sexual Activity   Alcohol use: Yes    Comment: occ   Drug use: Not Currently    Types: Marijuana   Sexual activity: Not Currently    Partners: Male    Birth control/protection: Injection  Other Topics Concern   Not on file  Social History Narrative   ** Merged History Encounter **       Social Determinants of Health   Financial Resource Strain: Not on file  Food Insecurity: Not on file  Transportation Needs: Not on file  Physical Activity: Not on file  Stress: Not on file  Social Connections: Not on file  Intimate Partner Violence: Not At Risk   Fear of Current or Ex-Partner: No   Emotionally Abused: No   Physically Abused: No   Sexually Abused: No    Family History  Problem Relation Age of Onset   Hypertension Father    Diabetes Father    Migraines Sister    Hypertension Sister     Past Medical History:  Diagnosis Date   Anemia 2008   h/o   Hypertension    Migraine    UTI (urinary tract infection)     Patient Active Problem List   Diagnosis Date Noted   Acute cholecystitis 02/24/2021   Migraine without aura and without status migrainosus, not intractable 01/17/2021   Essential hypertension 10/16/2020   Morbid obesity with BMI of 45.0-49.9, adult (HCC) 10/16/2020   Other hypersomnia  10/13/2020   OSA (obstructive sleep apnea) 10/13/2020   COVID-19 vaccination declined 09/08/2020   Bursitis of right shoulder 04/01/2017   Impingement syndrome of shoulder region 04/01/2017   Sterilization consult 09/14/2013    Past Surgical History:  Procedure Laterality Date   CHOLECYSTECTOMY     IUD REMOVAL  2018   SHOULDER ARTHROSCOPY WITH LABRAL REPAIR Right 06/23/2021   Procedure: Right posterior labral repair with possible biceps tenodesis;  Surgeon: Signa Kell, MD;  Location: [facility] ORS;  Service: Orthopedics;  Laterality: Right;   SHOULDER ARTHROSCOPY WITH SUBACROMIAL DECOMPRESSION Right 04/26/2017   Procedure: SHOULDER ARTHROSCOPY WITH SUBACROMIAL DECOMPRESSION;  Surgeon: Juanell Fairly, MD;  Location: [facility] ORS;  Service: Orthopedics;  Laterality: Right;    Current Outpatient Medications  Medication Sig Dispense Refill   Atogepant (QULIPTA) 60 MG TABS Take 60 mg by mouth daily. 30 tablet 11   acetaminophen (TYLENOL) 500 MG tablet Take 2 tablets (1,000 mg total) by mouth every 8 (eight) hours. 90 tablet 2   amLODipine (NORVASC)  10 MG tablet Take 1 tablet (10 mg total) by mouth daily. 90 tablet 0   famotidine (PEPCID) 20 MG tablet Take 1 tablet (20 mg total) by mouth 2 (two) times daily. 60 tablet 0   ondansetron (ZOFRAN-ODT) 4 MG disintegrating tablet Take 1 tablet (4 mg total) by mouth every 8 (eight) hours as needed for nausea or vomiting. 20 tablet 0   oxyCODONE (ROXICODONE) 5 MG immediate release tablet Take 1-2 tablets (5-10 mg total) by mouth every 4 (four) hours as needed (pain). 30 tablet 0   propranolol ER (INDERAL LA) 120 MG 24 hr capsule Take 1 capsule (120 mg total) by mouth daily. 90 capsule 6   rizatriptan (MAXALT-MLT) 10 MG disintegrating tablet Take 1 tablet (10 mg total) by mouth as needed for migraine. May repeat in 2 hours if needed 9 tablet 11   topiramate (TOPAMAX) 100 MG tablet Take 1 tablet (100 mg total) by mouth at bedtime as needed. Start with 1/2  tablet (50mg ) for 1-2 weeks then increase to a whole tablet at bedtime (100mg ) 90 tablet 6   Ubrogepant (UBRELVY) 100 MG TABS Take 100 mg by mouth every 2 (two) hours as needed. Maximum 200mg  a day. 16 tablet 11   Current Facility-Administered Medications  Medication Dose Route Frequency Provider Last Rate Last Admin   medroxyPROGESTERone (DEPO-PROVERA) injection 150 mg  150 mg Intramuscular Q90 days , FNP        Allergies as of 09/11/2021   (No Known Allergies)    Vitals: There were no vitals taken for this visit. Last Weight:  Wt Readings from Last 1 Encounters:  06/14/21 236 lb 6.4 oz (107.2 kg)   Last Height:   Ht Readings from Last 1 Encounters:  06/14/21 5' (1.524 m)   Phone visit (prior neuro exams non focal)  Neuro: Detailed Neurologic Exam  Speech:    Speech is normal; fluent and spontaneous with normal comprehension.  Cognition:    The patient is oriented to person, place, and time;     recent and remote memory intact;     language fluent;     normal attention, concentration,     fund of knowledge    Assessment/Plan: 36 year old patient with migraines.  She has tried and failed multiple medications in the past.  She states that she even tried 06/16/21 in the past and had samples when she had gone to another neurologist and tried it for at least 5 months.  At this time we discussed all her options for acute and preventative. Consider MRI brain if not improving(she has declined). Get eye exam.   From a review of records and patient report medications tried that can be used in migraine and headache management include: Failed emgality (had samples) for 5 months per patient, also propranolol which she has been on for at > 6 months does not seem to be helping a lot, she is also on amlodipine(calcium channel blocker like verapamil) which she has been on for at least 6 months, Imitrex, topiramate > 6 months, Maxalt, nortriptyline/amitriptyline( side  effects)  Currently patient is on topiramate, propranolol, Ubrelvy, Zofran, Tylenol, sumatriptan and amlodipine which are all medications that can be used in migraine management.  She is having 15 migraines a month, 20 total headache days a month, failed topiramate, propranolol, nortriptyline/amitroptyline.09-25-1990 today. If this fails next would try to get Ajovy approved. Aimovig contraindicated due to constipation. 15 migraines a month, 20 total headache days a month, failed topiramate, propranolol,  nortriptyline/amitroptyline., failed topiramate, propranolol, nortriptyline/amitroptyline.  No side effects to topiramate. Continue Topiramate 100mg , propranolol and amlodipine  Rizatroptan at onset and doesn't really help, same for imitrex. Marland Kitchen. She was prescribed and approved for ubrelvy last appointment.   Acute/emergency: Deirdre Peerndansetron(zofran) and Ubrelvy right away. Please take one tablet (can be together) at the onset of your headache. If it does not improve the symptoms please take one additional dose. Do not take more then 2 tablets in 24hrs. Do not take use more then 2 to 3 times in a week.   Propranol: continue 120mg  at bedtime; This is a good migraine prevention medication and may help with your elevated BP. Continue  Increased  Topiramate last appointment to 100mg ; This is a good migraine prevention medication and it also helps with weight  [facility] - discussed, she can just call for appointment   If these medications do NOT help or you have side effects, mychart me and we can try Ajovy.. Send me a mychart message.   At this time she declines an MRI of the brain.  At I do not see any red flags at this time, not exertional, not positional, appear to be stable as far as quality goes, but I did inform her if her migraines do not improve or she has new or worsening symptoms I would highly encourage an MRI of the brain.  Meds ordered this encounter  Medications    Atogepant (QULIPTA) 60 MG TABS    Sig: Take 60 mg by mouth daily.    Dispense:  30 tablet    Refill:  11    6 migraines a month, < 10 total headache days a month, failed topiramate, propranolol, nortriptyline/amitroptyline.     Cc: Llc, [facility]  [facility], [facility]  Naomie DeanAntonia Latonda Larrivee, MD  [facility] [address] [place], [place] [id number]  Phone [phone] Fax [phone]

## 2021-09-11 NOTE — Telephone Encounter (Signed)
Please call patient set her up with follow up with megan millikan  4-55months video for 30 minutes.  ?

## 2021-09-13 ENCOUNTER — Other Ambulatory Visit: Payer: Self-pay | Admitting: Nurse Practitioner

## 2021-09-13 ENCOUNTER — Other Ambulatory Visit: Payer: Self-pay | Admitting: Neurology

## 2021-09-13 DIAGNOSIS — I10 Essential (primary) hypertension: Secondary | ICD-10-CM

## 2021-11-20 ENCOUNTER — Telehealth: Payer: Self-pay | Admitting: *Deleted

## 2021-11-20 NOTE — Telephone Encounter (Signed)
Aleaya Schroeter Key: [id number] - PA Case ID: [id number] - Rx #: [id number]  PA Qulipta complete waiting on approval

## 2021-11-23 ENCOUNTER — Encounter: Payer: Self-pay | Admitting: Neurology

## 2021-11-23 NOTE — Telephone Encounter (Signed)
Initiated new PA on cover my meds. Waiting on next steps. Key: Corrinne Eagle

## 2021-11-23 NOTE — Telephone Encounter (Signed)
Unable to approve the medication (QULIPTA Tablet $RemoveBeforeD'60MG'ryAjTMnptBarEv$ ) due to unmet criteria (3, 5, 6) as  outlined in the plan guideline below.   Criteria for Preventative Treatment of Migraines (Aimovig, Ajovy, Emgality $RemoveBeforeD'120mg'pcxZxnxPCoddMf$ /ml, Nurtec ODT,  Qulipta, and Vyepti): Approval will be considered as treatment for beneficiaries who meet ALL of the following: 1. Beneficiary has a diagnosis of migraine with or without aura based on International Classification  of Headache Disorders criteria; 2. Beneficiary is 34 years old or older; 3. Beneficiary does not have medication over-use headache ([place]); 4. Beneficiaries that are women of childbearing age have had a negative pregnancy test at  baseline; (not required for Nurtec ODT or Qulipta) 5. Beneficiary has 4 or more migraine days per month for at least 3 months; 6. Beneficiary is utilizing prophylactic intervention modalities (e.g. behavioral therapy, physical  therapy, life-style modifications); 7. Beneficiary has tried and failed at least a month or greater trial of medications from at least 2  different classes from the following list of oral medications: a. Antidepressants (e.g. amitriptyline, venlafaxine) b. Beta Blockers (e.g. propranolol, metoprolol, timolol, atenolol) c. Anti-epileptics (e.g. valproate, topiramate) d. Angiotensin converting enzyme inhibitors/angiotensin II receptor blockers (e.g. lisinopril,  candesartan) e. Calcium Channel Blockers (e.g. verapamil, nimodipine) 8. For Nurtec ODT only: a. Beneficiary must NOT be concurrently using a strong CYP3A4 inhibitor; b. Beneficiary must NOT have end-stage renal disease (creatinine clearance (CrCl) less than 15  mL/min); 9. Initial approvals for up to a 72-month duration for Aimovig, Emgality, Nurtec ODT, Qulipta, or  Ajovy monthly dosing. 10. Initial approvals for up to a 69-month duration for Ajovy quarterly dosing and Vyepti. Note: If you believe the member has met criteria (3, 5, 6) as described  above, please resubmit your  request with additional clinically supportive documentation for consideration

## 2021-12-07 NOTE — Telephone Encounter (Signed)
Bennie Pierini PA completed on Cover My Meds. Key: [id number]. Awaiting determination. Request urgent review.

## 2021-12-13 ENCOUNTER — Encounter (INDEPENDENT_AMBULATORY_CARE_PROVIDER_SITE_OTHER): Payer: Self-pay

## 2021-12-19 ENCOUNTER — Encounter (INDEPENDENT_AMBULATORY_CARE_PROVIDER_SITE_OTHER): Payer: Self-pay

## 2021-12-19 ENCOUNTER — Ambulatory Visit (INDEPENDENT_AMBULATORY_CARE_PROVIDER_SITE_OTHER): Payer: Self-pay | Admitting: Family Medicine

## 2022-01-02 ENCOUNTER — Ambulatory Visit (INDEPENDENT_AMBULATORY_CARE_PROVIDER_SITE_OTHER): Payer: Self-pay | Admitting: Family Medicine

## 2022-01-03 NOTE — Telephone Encounter (Signed)
Unable to approve the medication (QULIPTA Tablet 60MG) due to unmet criteria (3 ,5, 6) as  outlined in the plan guideline below. Criteria for Preventative Treatment of Migraines (Aimovig, Ajovy, Emgality 177m/ml, Nurtec ODT,  QLenoria Chime and Vyepti): Approval will be considered as treatment for beneficiaries who meet ALL of the following: 1. Beneficiary has a diagnosis of migraine with or without aura based on International Classification  of Headache Disorders criteria; 2. Beneficiary is 189years old or older; 3. Beneficiary does not have medication over-use headache ([place]; 4. Beneficiaries that are women of childbearing age have had a negative pregnancy test at  baseline; (not required for Nurtec ODT or Qulipta) 5. Beneficiary has 4 or more migraine days per month for at least 3 months; 6. Beneficiary is utilizing prophylactic intervention modalities (e.g. behavioral therapy, physical  therapy, life-style modifications); 7. Beneficiary has tried and failed at least a month or greater trial of medications from at least 2  different classes from the following list of oral medications: a. Antidepressants (e.g. amitriptyline, venlafaxine) b. Beta Blockers (e.g. propranolol, metoprolol, timolol, atenolol) c. Anti-epileptics (e.g. valproate, topiramate) d. Angiotensin converting enzyme inhibitors/angiotensin II receptor blockers (e.g. lisinopril,  candesartan) e. Calcium Channel Blockers (e.g. verapamil, nimodipine) 8. For Nurtec ODT only: a. Beneficiary must NOT be concurrently using a strong CYP3A4 inhibitor; b. Beneficiary must NOT have end-stage renal disease (creatinine clearance (CrCl) less than 15  mL/min); 9. Initial approvals for up to a 376-monthuration for Aimovig, Emgality, Nurtec ODT, Qulipta, or  Ajovy monthly dosing. 10. Initial approvals for up to a 6-37-monthration for Ajovy quarterly dosing and Vyepti. Note: If you believe the member has met criteria (3, 5, 6) as described  above, please resubmit your  request with additional clinically supportive documentation for consideration  FAX: Fill out and sign the Appeal Request Form in this Notice. The fax number is on the form

## 2022-01-09 MED ORDER — AJOVY 225 MG/1.5ML ~~LOC~~ SOAJ: 225.0000 mg | SUBCUTANEOUS | 3 refills | Status: DC

## 2022-01-09 NOTE — Telephone Encounter (Signed)
Per Dr Lucia Gaskins, Ajovy 225 mg has been prescribed and patient has been updated.

## 2022-01-09 NOTE — Addendum Note (Signed)
Addended by: Bertram Savin on: 01/09/2022 08:05 AM   Modules accepted: Orders

## 2022-01-10 ENCOUNTER — Telehealth: Payer: Self-pay

## 2022-01-10 NOTE — Telephone Encounter (Signed)
PA sent to plan via Covermymeds. Awaiting determination from [facility].

## 2022-02-05 ENCOUNTER — Encounter: Payer: 59 | Admitting: Adult Health

## 2022-02-05 ENCOUNTER — Telehealth: Payer: Self-pay | Admitting: Adult Health

## 2022-02-05 NOTE — Progress Notes (Unsigned)
PATIENT: Ellen Richardson DOB: 1985-09-15  REASON FOR VISIT: follow up HISTORY FROM: patient  Virtual Visit via Video Note  I connected with Trinidi Connolly on 02/05/22 at  1:30 PM EDT by a video enabled telemedicine application located remotely at [facility] and verified that I am speaking with the correct person using two identifiers who was located at their own home.   I discussed the limitations of evaluation and management by telemedicine and the availability of in person appointments. The patient expressed understanding and agreed to proceed.   PATIENT: Ellen Richardson DOB: [birth year]  REASON FOR VISIT: follow up HISTORY FROM: patient  HISTORY OF PRESENT ILLNESS: Today 02/05/22  HISTORY Sep 11, 2021: Currently patient is on topiramate, propranolol, Ubrelvy, Zofran, Tylenol, sumatriptan and amlodipine which are all medications that can be used in migraine management.  At last appointment we spoke if she was not well controlled we could switch her to [place] or Ajovy. She is having 15 migraine days a month and 20total headaches. Tried [employer]. Next would try Ajovy. Aimovig contraindicated due to constipation. 15 migraines a month, 20 total headache days a month, failed topiramate, propranolol, nortriptyline/amitroptyline.    REVIEW OF SYSTEMS: Out of a complete 14 system review of symptoms, the patient complains only of the following symptoms, and all other reviewed systems are negative.  ALLERGIES: No Known Allergies  HOME MEDICATIONS: Outpatient Medications Prior to Visit  Medication Sig Dispense Refill   acetaminophen (TYLENOL) 500 MG tablet Take 2 tablets (1,000 mg total) by mouth every 8 (eight) hours. 90 tablet 2   amLODipine (NORVASC) 10 MG tablet Take 1 tablet (10 mg total) by mouth daily. 90 tablet 0   famotidine (PEPCID) 20 MG tablet Take 1 tablet (20 mg total) by mouth 2 (two) times daily. 60 tablet 0   Fremanezumab-vfrm (AJOVY) 225 MG/1.5ML SOAJ  Inject 225 mg into the skin every 30 (thirty) days. 1.5 mL 3   ondansetron (ZOFRAN-ODT) 4 MG disintegrating tablet Take 1 tablet (4 mg total) by mouth every 8 (eight) hours as needed for nausea or vomiting. 20 tablet 0   oxyCODONE (ROXICODONE) 5 MG immediate release tablet Take 1-2 tablets (5-10 mg total) by mouth every 4 (four) hours as needed (pain). 30 tablet 0   propranolol ER (INDERAL LA) 120 MG 24 hr capsule Take 1 capsule (120 mg total) by mouth daily. 90 capsule 6   rizatriptan (MAXALT-MLT) 10 MG disintegrating tablet Take 1 tablet (10 mg total) by mouth as needed for migraine. May repeat in 2 hours if needed 9 tablet 11   topiramate (TOPAMAX) 100 MG tablet Take 1 tablet (100 mg total) by mouth at bedtime as needed. Start with 1/2 tablet (50mg ) for 1-2 weeks then increase to a whole tablet at bedtime (100mg ) 90 tablet 6   Ubrogepant (UBRELVY) 100 MG TABS Take 100 mg by mouth every 2 (two) hours as needed. Maximum 200mg  a day. 16 tablet 11   No facility-administered medications prior to visit.    PAST MEDICAL HISTORY: Past Medical History:  Diagnosis Date   Anemia 2008   h/o   Hypertension    Migraine    UTI (urinary tract infection)     PAST SURGICAL HISTORY: Past Surgical History:  Procedure Laterality Date   CHOLECYSTECTOMY     IUD REMOVAL  2018   SHOULDER ARTHROSCOPY WITH LABRAL REPAIR Right 06/23/2021   Procedure: Right posterior labral repair with possible biceps tenodesis;  Surgeon: Leim Fabry, MD;  Location:  [facility]S;  Service: Orthopedics;  Laterality: Right;   SHOULDER ARTHROSCOPY WITH SUBACROMIAL DECOMPRESSION Right 04/26/2017   Procedure: SHOULDER ARTHROSCOPY WITH SUBACROMIAL DECOMPRESSION;  Surgeon: Juanell Fairly, MD;  Location: [facility] ORS;  Service: Orthopedics;  Laterality: Right;    FAMILY HISTORY: Family History  Problem Relation Age of Onset   Hypertension Father    Diabetes Father    Migraines Sister    Hypertension Sister     SOCIAL  HISTORY: Social History   Socioeconomic History   Marital status: Married    Spouse name: Antanio   Number of children: 2   Years of education: Not on file   Highest education level: Not on file  Occupational History   Not on file  Tobacco Use   Smoking status: Never   Smokeless tobacco: Never  Vaping Use   Vaping Use: Never used  Substance and Sexual Activity   Alcohol use: Yes    Comment: occ   Drug use: Not Currently    Types: Marijuana   Sexual activity: Not Currently    Partners: Male    Birth control/protection: Injection  Other Topics Concern   Not on file  Social History Narrative   ** Merged History Encounter **       Social Determinants of Health   Financial Resource Strain: Not on file  Food Insecurity: Not on file  Transportation Needs: Not on file  Physical Activity: Not on file  Stress: Not on file  Social Connections: Not on file  Intimate Partner Violence: Not At Risk (01/25/2021)   Humiliation, Afraid, Rape, and Kick questionnaire    Fear of Current or Ex-Partner: No    Emotionally Abused: No    Physically Abused: No    Sexually Abused: No      PHYSICAL EXAM Generalized: Well developed, in no acute distress   Neurological examination  Mentation: Alert oriented to time, place, history taking. Follows all commands speech and language fluent Cranial nerve II-XII:Extraocular movements were full. Facial symmetry noted. uvula tongue midline. Head turning and shoulder shrug  were normal and symmetric. Motor: Good strength throughout subjectively per patient Sensory: Sensory testing is intact to soft touch on all 4 extremities subjectively per patient Coordination: Cerebellar testing reveals good finger-nose-finger  Gait and station: Patient is able to stand from a seated position. gait is normal.  Reflexes: UTA  DIAGNOSTIC DATA (LABS, IMAGING, TESTING) - I reviewed patient records, labs, notes, testing and imaging myself where available.  Lab  Results  Component Value Date   WBC 5.7 06/22/2021   HGB 11.2 (L) 06/22/2021   HCT 34.3 (L) 06/22/2021   MCV 81.7 06/22/2021   PLT 282 06/22/2021      Component Value Date/Time   NA 137 06/22/2021 0823   NA 138 10/18/2020 1400   K 3.6 06/22/2021 0823   CL 110 06/22/2021 0823   CO2 21 (L) 06/22/2021 0823   GLUCOSE 107 (H) 06/22/2021 0823   BUN 11 06/22/2021 0823   BUN 6 10/18/2020 1400   CREATININE 0.88 06/22/2021 0823   CALCIUM 8.6 (L) 06/22/2021 0823   PROT 8.0 03/14/2021 0818   PROT 7.0 10/18/2020 1400   ALBUMIN 3.8 03/14/2021 0818   ALBUMIN 4.1 10/18/2020 1400   AST 16 03/14/2021 0818   ALT 11 03/14/2021 0818   ALKPHOS 46 03/14/2021 0818   BILITOT 0.4 03/14/2021 0818   BILITOT 0.4 10/18/2020 1400   GFRNONAA >60 06/22/2021 0823   GFRAA 125 08/11/2019 1039   Lab Results  Component Value Date   CHOL 145 10/18/2020   HDL 33 (L) 10/18/2020   LDLCALC 94 10/18/2020   TRIG 98 10/18/2020   Lab Results  Component Value Date   HGBA1C 5.5 10/10/2020   Lab Results  Component Value Date   VITAMINB12 998 10/18/2020   Lab Results  Component Value Date   TSH 1.330 10/18/2020      ASSESSMENT AND PLAN 36 y.o. year old female  has a past medical history of Anemia (2008), Hypertension, Migraine, and UTI (urinary tract infection). here with ***   I spent 15 minutes with the patient. 50% of this time was spent   Butch Penny, MSN, NP-C 02/05/2022, 1:04 PM [facility] [address], [address] [place], [place] [address] [phone]

## 2022-02-05 NOTE — Telephone Encounter (Signed)
Called pt to inform her that she would need to log in again to her Mychart Video Visit due to the provider not being able to see her but could hear her. Pt did not answer but I LVM stating such and that if she could to go ahead and try to log on again at 2pm so the provider can connect with her but if she had any questions to please call the office back.

## 2022-02-06 ENCOUNTER — Other Ambulatory Visit: Payer: Self-pay | Admitting: Neurology

## 2022-02-06 ENCOUNTER — Telehealth: Payer: Self-pay | Admitting: Adult Health

## 2022-02-06 NOTE — Progress Notes (Signed)
Patient scheduled for video visit. Appeared to be logged on- I tried to connect but her screen was black and she couldn't hear me. Staff from our office tried to call the patient but was unable to reach her. They LVM. This encounter was created in error - please disregard.

## 2023-03-06 ENCOUNTER — Other Ambulatory Visit: Payer: Self-pay | Admitting: Neurology

## 2023-03-06 ENCOUNTER — Telehealth: Payer: Self-pay | Admitting: Adult Health

## 2023-03-06 DIAGNOSIS — G43009 Migraine without aura, not intractable, without status migrainosus: Secondary | ICD-10-CM

## 2023-03-06 MED ORDER — RIZATRIPTAN BENZOATE 10 MG PO TBDP: 10.0000 mg | ORAL_TABLET | ORAL | 2 refills | Status: DC | PRN

## 2023-03-06 MED ORDER — TOPIRAMATE 100 MG PO TABS: 100.0000 mg | ORAL_TABLET | Freq: Every evening | ORAL | 0 refills | Status: DC | PRN

## 2023-03-06 NOTE — Telephone Encounter (Signed)
Will send refill request to pharmacy

## 2023-03-06 NOTE — Addendum Note (Signed)
Addended by: Raynald Kemp A on: 03/06/2023 04:42 PM   Modules accepted: Orders

## 2023-03-06 NOTE — Telephone Encounter (Signed)
LVM to call back and discuss refill request

## 2023-03-06 NOTE — Telephone Encounter (Signed)
Pt is asking for refills of Topamax and Maxalt to [place] [facility] [place] [address] She is scheduled for an appt with Megan for 05/06/23.

## 2023-05-05 ENCOUNTER — Telehealth: Payer: Self-pay | Admitting: Adult Health

## 2023-05-05 NOTE — Telephone Encounter (Signed)
 Pt was called and VM was left to be informed that due to inclement weather, their appt has been cx and they will be called back tomorrow to r/s.

## 2023-05-05 NOTE — Progress Notes (Deleted)
 PATIENT: Ellen Richardson DOB: July 12, 1985  REASON FOR VISIT: follow up HISTORY FROM: patient PRIMARY NEUROLOGIST:   Virtual Visit via Video Note  I connected with Toriana Slates on 05/05/23 at  8:30 AM EST by a video enabled telemedicine application located remotely at [facility] and verified that I am speaking with the correct person using two identifiers who was located at their own home.   I discussed the limitations of evaluation and management by telemedicine and the availability of in person appointments. The patient expressed understanding and agreed to proceed.   PATIENT: Ellen Alegria DOB: 10-Oct-1985  REASON FOR VISIT: follow up HISTORY FROM: patient  HISTORY OF PRESENT ILLNESS: Today 05/05/23  HISTORY   REVIEW OF SYSTEMS: Out of a complete 14 system review of symptoms, the patient complains only of the following symptoms, and all other reviewed systems are negative.  ALLERGIES: No Known Allergies  HOME MEDICATIONS: Outpatient Medications Prior to Visit  Medication Sig Dispense Refill   amLODipine  (NORVASC ) 10 MG tablet Take 1 tablet (10 mg total) by mouth daily. 90 tablet 0   famotidine  (PEPCID ) 20 MG tablet Take 1 tablet (20 mg total) by mouth 2 (two) times daily. 60 tablet 0   Fremanezumab -vfrm (AJOVY ) 225 MG/1.5ML SOAJ Inject 225 mg into the skin every 30 (thirty) days. 1.5 mL 3   ondansetron  (ZOFRAN -ODT) 4 MG disintegrating tablet Take 1 tablet (4 mg total) by mouth every 8 (eight) hours as needed for nausea or vomiting. 20 tablet 0   propranolol  ER (INDERAL  LA) 120 MG 24 hr capsule Take 1 capsule (120 mg total) by mouth daily. 90 capsule 6   rizatriptan  (MAXALT -MLT) 10 MG disintegrating tablet Take 1 tablet (10 mg total) by mouth as needed for migraine. May repeat in 2 hours if needed 9 tablet 2   topiramate  (TOPAMAX ) 100 MG tablet Take 1 tablet (100 mg total) by mouth at bedtime as needed. Take 1 tablet 100 mg at Bedtime 90 tablet 0    Ubrogepant  (UBRELVY ) 100 MG TABS Take 100 mg by mouth every 2 (two) hours as needed. Maximum 200mg  a day. 16 tablet 11   No facility-administered medications prior to visit.    PAST MEDICAL HISTORY: Past Medical History:  Diagnosis Date   Anemia 2008   h/o   Hypertension    Migraine    UTI (urinary tract infection)     PAST SURGICAL HISTORY: Past Surgical History:  Procedure Laterality Date   CHOLECYSTECTOMY     IUD REMOVAL  2018   SHOULDER ARTHROSCOPY WITH LABRAL REPAIR Right 06/23/2021   Procedure: Right posterior labral repair with possible biceps tenodesis;  Surgeon: Tobie Priest, MD;  Location: [facility];  Service: Orthopedics;  Laterality: Right;   SHOULDER ARTHROSCOPY WITH SUBACROMIAL DECOMPRESSION Right 04/26/2017   Procedure: SHOULDER ARTHROSCOPY WITH SUBACROMIAL DECOMPRESSION;  Surgeon: Marchia Drivers, MD;  Location: [facility];  Service: Orthopedics;  Laterality: Right;    FAMILY HISTORY: Family History  Problem Relation Age of Onset   Hypertension Father    Diabetes Father    Migraines Sister    Hypertension Sister     SOCIAL HISTORY: Social History   Socioeconomic History   Marital status: Married    Spouse name: Antanio   Number of children: 2   Years of education: Not on file   Highest education level: Not on file  Occupational History   Not on file  Tobacco Use   Smoking status: Never   Smokeless tobacco: Never  Vaping Use   Vaping status: Never Used  Substance and Sexual Activity   Alcohol use: Yes    Comment: occ   Drug use: Not Currently    Types: Marijuana   Sexual activity: Not Currently    Partners: Male    Birth control/protection: Injection  Other Topics Concern   Not on file  Social History Narrative   ** Merged History Encounter **       Social Drivers of Corporate Investment Banker Strain: Not on file  Food Insecurity: Not on file  Transportation Needs: Not on file  Physical Activity: Not on file  Stress: Not on file   Social Connections: Not on file  Intimate Partner Violence: Not At Risk (01/25/2021)   Humiliation, Afraid, Rape, and Kick questionnaire    Fear of Current or Ex-Partner: No    Emotionally Abused: No    Physically Abused: No    Sexually Abused: No      PHYSICAL EXAM Generalized: Well developed, in no acute distress   Neurological examination  Mentation: Alert oriented to time, place, history taking. Follows all commands speech and language fluent Cranial nerve II-XII:Extraocular movements were full. Facial symmetry noted. uvula tongue midline. Head turning and shoulder shrug  were normal and symmetric. Motor: Good strength throughout subjectively per patient Sensory: Sensory testing is intact to soft touch on all 4 extremities subjectively per patient Coordination: Cerebellar testing reveals good finger-nose-finger  Gait and station: Patient is able to stand from a seated position. gait is normal.  Reflexes: UTA  DIAGNOSTIC DATA (LABS, IMAGING, TESTING) - I reviewed patient records, labs, notes, testing and imaging myself where available.  Lab Results  Component Value Date   WBC 5.7 06/22/2021   HGB 11.2 (L) 06/22/2021   HCT 34.3 (L) 06/22/2021   MCV 81.7 06/22/2021   PLT 282 06/22/2021      Component Value Date/Time   NA 137 06/22/2021 0823   NA 138 10/18/2020 1400   K 3.6 06/22/2021 0823   CL 110 06/22/2021 0823   CO2 21 (L) 06/22/2021 0823   GLUCOSE 107 (H) 06/22/2021 0823   BUN 11 06/22/2021 0823   BUN 6 10/18/2020 1400   CREATININE 0.88 06/22/2021 0823   CALCIUM 8.6 (L) 06/22/2021 0823   PROT 8.0 03/14/2021 0818   PROT 7.0 10/18/2020 1400   ALBUMIN 3.8 03/14/2021 0818   ALBUMIN 4.1 10/18/2020 1400   AST 16 03/14/2021 0818   ALT 11 03/14/2021 0818   ALKPHOS 46 03/14/2021 0818   BILITOT 0.4 03/14/2021 0818   BILITOT 0.4 10/18/2020 1400   GFRNONAA >60 06/22/2021 0823   GFRAA 125 08/11/2019 1039   Lab Results  Component Value Date   CHOL 145 10/18/2020    HDL 33 (L) 10/18/2020   LDLCALC 94 10/18/2020   TRIG 98 10/18/2020   Lab Results  Component Value Date   HGBA1C 5.5 10/10/2020   Lab Results  Component Value Date   VITAMINB12 998 10/18/2020   Lab Results  Component Value Date   TSH 1.330 10/18/2020      ASSESSMENT AND PLAN 38 y.o. year old female  has a past medical history of Anemia (2008), Hypertension, Migraine, and UTI (urinary tract infection). here with:  OSA on CPAP  CPAP compliance excellent Residual AHI is good Encouraged patient to continue using CPAP nightly and > 4 hours each night F/U in 1 year or sooner if needed  I spent 20 minutes of face-to-face and non-face-to-face time with patient.  This included previsit chart review, lab review, study review, order entry, electronic health record documentation, patient education.  Duwaine Russell, MSN, NP-C 05/05/2023, 1:02 AM [facility] [address], [address] [place], [place] [address] [phone]

## 2023-05-06 ENCOUNTER — Telehealth: Payer: 59 | Admitting: Adult Health

## 2023-05-06 NOTE — Telephone Encounter (Signed)
 Spoke with pt and got her rescheduled to 1/7 at 830 am for mychart VV.

## 2023-05-07 ENCOUNTER — Telehealth (INDEPENDENT_AMBULATORY_CARE_PROVIDER_SITE_OTHER): Payer: 59 | Admitting: Adult Health

## 2023-05-07 DIAGNOSIS — G43009 Migraine without aura, not intractable, without status migrainosus: Secondary | ICD-10-CM | POA: Diagnosis not present

## 2023-05-07 MED ORDER — QULIPTA 60 MG PO TABS: 60.0000 mg | ORAL_TABLET | Freq: Every day | ORAL | 5 refills | Status: DC

## 2023-05-07 NOTE — Patient Instructions (Signed)
 Your Plan:  Continue topiramate   Start Qulipta  60 mg daily  Use rizatriptan  only when you get a migraine     Thank you for coming to see us  at [facility]. I hope we have been able to provide you high quality care today.  You may receive a patient satisfaction survey over the next few weeks. We would appreciate your feedback and comments so that we may continue to improve ourselves and the health of our patients.

## 2023-05-07 NOTE — Progress Notes (Signed)
 PATIENT: Ellen Richardson DOB: 1985/11/16  REASON FOR VISIT: follow up HISTORY FROM: patient  Virtual Visit via Video Note  I connected with Kaizley [place] on 05/07/23 at  8:30 AM EST by a video enabled telemedicine application located remotely at [facility] and verified that I am speaking with the correct person using two identifiers who was located at their own home.   I discussed the limitations of evaluation and management by telemedicine and the availability of in person appointments. The patient expressed understanding and agreed to proceed.   PATIENT: Ellen Richardson DOB: [birth year]  REASON FOR VISIT: follow up HISTORY FROM: patient  HISTORY OF PRESENT ILLNESS: Today 05/07/23:  [name] is a 38 y.o. female with a history of migraine headaches. Returns today for follow-up.  She reports that she has approximately 2 headaches a week.  She currently is taking topiramate  daily.  She uses rizatriptan  for abortive therapy.  She reports that it works relatively well but and sometimes not always is quick.  She never tried Ajovy .  She was given Ubrelvy  samples but not sure how effective they were.  She would like to try another preventative medication.  She returns today for an evaluation.     Follow-up Sep 11, 2021: Currently patient is on topiramate , propranolol , Ubrelvy , Zofran , Tylenol , sumatriptan  and amlodipine  which are all medications that can be used in migraine management.  At last appointment we spoke if she was not well controlled we could switch her to Qulipta  or Ajovy . She is having 15 migraine days a month and 20 total headaches. Tried [employer] Next would try Ajovy . Aimovig contraindicated due to constipation. 15 migraines a month, 20 total headache days a month, failed topiramate , propranolol , nortriptyline/amitroptyline.   Patient complains of symptoms per HPI as well as the following symptoms: improved migraines . Pertinent negatives and positives  per HPI. All others negative   Follow up 06/14/2021: The topiramate  helped but last 2 weeks the migraines have returned. She has tried Rizatroptan at onset and doesn't really help, same for imitrex . No side effects. Increase Topiramate  to 50mg  then 100mg . Try Ubelvy. She has 6 migraine days a month, total 6 headache days a month.   HPI:  Ellen Richardson is a 38 y.o. female here as requested by [facility] for migraines. Started about a year ago. She has family history in her sister with migraines. She was getting her migraines every day but they have significantly improved. Pulsating/pounding/throbbing, all over the head, can be unilateral, endorses nausea, also endorses light/sound sensitivity, they can last all day and be moderate to severe in pain. She has 10 migraine days a month.  No known etiology or inciting event.  Sumatriptan  did not help a lot. She is on a lot of medications for pain right now, meloxicam, tramadol and Is also on propranolol  and norvasc  per patient, she said possibly the propranolol  has helped a little bit.  We discussed all her options.  We will hold off in an MRI since her symptoms appear to be improving and there is no red flags such as no vision changes, not positional or exertional, and her neurologic exam is normal.  We discussed options, prior older oral medications, the newer CGRP oral and injectable medications.  She really does not know what triggers the headaches, or makes them worse.No other focal neurologic deficits, associated symptoms, inciting events or modifiable factors.   Reviewed notes, labs and imaging from outside physicians, which showed:  From a review of records and patient report medications tried that can be used in migraine and headache management include: Failed emgality (had samples) for 5 months per patient, also propranolol  which she has been on for at least 3 months does not seem to be helping a lot, she is also on amlodipine  which she has  been on for at least 3 months, Imitrex , starting topiramate , Maxalt .      REVIEW OF SYSTEMS: Out of a complete 14 system review of symptoms, the patient complains only of the following symptoms, and all other reviewed systems are negative.  ALLERGIES: No Known Allergies  HOME MEDICATIONS: Outpatient Medications Prior to Visit  Medication Sig Dispense Refill   amLODipine  (NORVASC ) 10 MG tablet Take 1 tablet (10 mg total) by mouth daily. 90 tablet 0   famotidine  (PEPCID ) 20 MG tablet Take 1 tablet (20 mg total) by mouth 2 (two) times daily. 60 tablet 0   Fremanezumab -vfrm (AJOVY ) 225 MG/1.5ML SOAJ Inject 225 mg into the skin every 30 (thirty) days. 1.5 mL 3   ondansetron  (ZOFRAN -ODT) 4 MG disintegrating tablet Take 1 tablet (4 mg total) by mouth every 8 (eight) hours as needed for nausea or vomiting. 20 tablet 0   propranolol  ER (INDERAL  LA) 120 MG 24 hr capsule Take 1 capsule (120 mg total) by mouth daily. 90 capsule 6   rizatriptan  (MAXALT -MLT) 10 MG disintegrating tablet Take 1 tablet (10 mg total) by mouth as needed for migraine. May repeat in 2 hours if needed 9 tablet 2   topiramate  (TOPAMAX ) 100 MG tablet Take 1 tablet (100 mg total) by mouth at bedtime as needed. Take 1 tablet 100 mg at Bedtime 90 tablet 0   Ubrogepant  (UBRELVY ) 100 MG TABS Take 100 mg by mouth every 2 (two) hours as needed. Maximum 200mg  a day. 16 tablet 11   No facility-administered medications prior to visit.    PAST MEDICAL HISTORY: Past Medical History:  Diagnosis Date   Anemia 2008   h/o   Hypertension    Migraine    UTI (urinary tract infection)     PAST SURGICAL HISTORY: Past Surgical History:  Procedure Laterality Date   CHOLECYSTECTOMY     IUD REMOVAL  2018   SHOULDER ARTHROSCOPY WITH LABRAL REPAIR Right 06/23/2021   Procedure: Right posterior labral repair with possible biceps tenodesis;  Surgeon: Tobie Priest, MD;  Location: [facility];  Service: Orthopedics;  Laterality: Right;   SHOULDER  ARTHROSCOPY WITH SUBACROMIAL DECOMPRESSION Right 04/26/2017   Procedure: SHOULDER ARTHROSCOPY WITH SUBACROMIAL DECOMPRESSION;  Surgeon: Marchia Drivers, MD;  Location: [facility];  Service: Orthopedics;  Laterality: Right;    FAMILY HISTORY: Family History  Problem Relation Age of Onset   Hypertension Father    Diabetes Father    Migraines Sister    Hypertension Sister     SOCIAL HISTORY: Social History   Socioeconomic History   Marital status: Married    Spouse name: Antanio   Number of children: 2   Years of education: Not on file   Highest education level: Not on file  Occupational History   Not on file  Tobacco Use   Smoking status: Never   Smokeless tobacco: Never  Vaping Use   Vaping status: Never Used  Substance and Sexual Activity   Alcohol use: Yes    Comment: occ   Drug use: Not Currently    Types: Marijuana   Sexual activity: Not Currently    Partners: Male    Birth control/protection: Injection  Other Topics Concern   Not on file  Social History Narrative   ** Merged History Encounter **       Social Drivers of Corporate Investment Banker Strain: Not on file  Food Insecurity: Not on file  Transportation Needs: Not on file  Physical Activity: Not on file  Stress: Not on file  Social Connections: Not on file  Intimate Partner Violence: Not At Risk (01/25/2021)   Humiliation, Afraid, Rape, and Kick questionnaire    Fear of Current or Ex-Partner: No    Emotionally Abused: No    Physically Abused: No    Sexually Abused: No      PHYSICAL EXAM Generalized: Well developed, in no acute distress   Neurological examination  Mentation: Alert oriented to time, place, history taking. Follows all commands speech and language fluent Cranial nerve II-XII facial symmetry noted  DIAGNOSTIC DATA (LABS, IMAGING, TESTING) - I reviewed patient records, labs, notes, testing and imaging myself where available.  Lab Results  Component Value Date   WBC 5.7  06/22/2021   HGB 11.2 (L) 06/22/2021   HCT 34.3 (L) 06/22/2021   MCV 81.7 06/22/2021   PLT 282 06/22/2021      Component Value Date/Time   NA 137 06/22/2021 0823   NA 138 10/18/2020 1400   K 3.6 06/22/2021 0823   CL 110 06/22/2021 0823   CO2 21 (L) 06/22/2021 0823   GLUCOSE 107 (H) 06/22/2021 0823   BUN 11 06/22/2021 0823   BUN 6 10/18/2020 1400   CREATININE 0.88 06/22/2021 0823   CALCIUM 8.6 (L) 06/22/2021 0823   PROT 8.0 03/14/2021 0818   PROT 7.0 10/18/2020 1400   ALBUMIN 3.8 03/14/2021 0818   ALBUMIN 4.1 10/18/2020 1400   AST 16 03/14/2021 0818   ALT 11 03/14/2021 0818   ALKPHOS 46 03/14/2021 0818   BILITOT 0.4 03/14/2021 0818   BILITOT 0.4 10/18/2020 1400   GFRNONAA >60 06/22/2021 0823   GFRAA 125 08/11/2019 1039   Lab Results  Component Value Date   CHOL 145 10/18/2020   HDL 33 (L) 10/18/2020   LDLCALC 94 10/18/2020   TRIG 98 10/18/2020   Lab Results  Component Value Date   HGBA1C 5.5 10/10/2020   Lab Results  Component Value Date   VITAMINB12 998 10/18/2020   Lab Results  Component Value Date   TSH 1.330 10/18/2020      ASSESSMENT AND PLAN 38 y.o. year old female  has a past medical history of Anemia (2008), Hypertension, Migraine, and UTI (urinary tract infection). here with:  1.  Migraine headaches  -Continue topiramate  100 mg at bedtime -Start Qulipta  60 mg daily.  I did review potential side effects and contraindications with the patient.  I provided her information about Qulipta  on her after visit summary. -Continue rizatriptan  for abortive therapy.  Take 10 mg at the onset of a migraine can repeat in 2 hours if needed. -Follow-up in 6 to 8 months or sooner if needed     Duwaine Russell, MSN, NP-C 05/07/2023, 9:04 AM [facility] [address], [address] [place], [place] [address] [phone]

## 2023-05-21 ENCOUNTER — Encounter

## 2023-06-14 ENCOUNTER — Telehealth: Payer: Self-pay | Admitting: Pharmacist

## 2023-06-14 NOTE — Telephone Encounter (Signed)
Pharmacy Patient Advocate Encounter   Received notification from Physician's Office that prior authorization for Qulipta 60MG  tablets is required/requested.   Insurance verification completed.   The patient is insured through [facility] .   Per test claim: PA required; PA submitted to above mentioned insurance via CoverMyMeds Key/confirmation #/EOC [facility] Status is pending

## 2023-06-18 ENCOUNTER — Other Ambulatory Visit (HOSPITAL_COMMUNITY): Payer: Self-pay

## 2023-06-18 NOTE — Telephone Encounter (Signed)
Pharmacy Patient Advocate Encounter  Received notification from [facility] that Prior Authorization for Qulipta 60MG  tablets has been APPROVED from 06/14/2023 to 12/12/2023   PA #/Case ID/Reference #: [id number]

## 2023-06-18 NOTE — Telephone Encounter (Signed)
I called and LMVM for pt mobile, that qulipta approved.  She should be able to get the medication from her pharmacy.  She may have to reach out to them to fill.  She is to call back if questions.

## 2023-10-15 ENCOUNTER — Other Ambulatory Visit: Payer: Self-pay | Admitting: Neurology

## 2023-10-15 DIAGNOSIS — G43009 Migraine without aura, not intractable, without status migrainosus: Secondary | ICD-10-CM

## 2023-11-06 ENCOUNTER — Other Ambulatory Visit: Payer: Self-pay | Admitting: Neurology

## 2023-11-14 ENCOUNTER — Other Ambulatory Visit (HOSPITAL_COMMUNITY): Payer: Self-pay

## 2023-12-03 ENCOUNTER — Telehealth: Payer: Self-pay

## 2023-12-03 ENCOUNTER — Other Ambulatory Visit (HOSPITAL_COMMUNITY): Payer: Self-pay

## 2023-12-03 NOTE — Telephone Encounter (Signed)
 It is time to renew the PA for Qulipta , However PT has not been evaluated since starting Qulipta  and insurance is looking for documentation answering the following questions below. Please advise.

## 2024-01-21 ENCOUNTER — Telehealth (INDEPENDENT_AMBULATORY_CARE_PROVIDER_SITE_OTHER): Payer: 59 | Admitting: Adult Health

## 2024-01-21 DIAGNOSIS — G43009 Migraine without aura, not intractable, without status migrainosus: Secondary | ICD-10-CM | POA: Diagnosis not present

## 2024-01-21 MED ORDER — QULIPTA 60 MG PO TABS: 60.0000 mg | ORAL_TABLET | Freq: Every day | ORAL | 5 refills | Status: AC

## 2024-01-21 NOTE — Patient Instructions (Signed)
 Your Plan:  Restart Qulipta  60 mg daily Continue rizatriptan  for abortive therapy Continue Zofran  for nausea     Thank you for coming to see us  at [facility]. I hope we have been able to provide you high quality care today.  You may receive a patient satisfaction survey over the next few weeks. We would appreciate your feedback and comments so that we may continue to improve ourselves and the health of our patients.

## 2024-01-21 NOTE — Progress Notes (Signed)
 PATIENT: Karah Erich DOB: 17-Nov-1985  REASON FOR VISIT: follow up HISTORY FROM: patient  Virtual Visit via Video Note  I connected with Iliany Mcfayden on 01/21/24 at  2:45 PM EDT by a video enabled telemedicine application located remotely at [facility] and verified that I am speaking with the correct person using two identifiers who was located at their own home.   I discussed the limitations of evaluation and management by telemedicine and the availability of in person appointments. The patient expressed understanding and agreed to proceed.   PATIENT: Lean Toran DOB: [birth year]  REASON FOR VISIT: follow up HISTORY FROM: patient  HISTORY OF PRESENT ILLNESS: Today 01/21/24: Taralyn Wellen is a 38 y.o. female with a history of migraine headaches. Returns today for follow-up.  She states that she was able to take Qulipta  for 1 month but then her insurance would not approve it.  Reading through the chart it seems that they needed information to see how she was responding to it.  She states that she has been off Topamax  since June.  She reports that she went almost a full month without having a headache.  She states that she had a headache last week and it was a severe migraine.  She typically does not have an aura with her headaches but states that the headache she had last week she noticed flashing lights in her vision during the headache.  Reports that it was relatively brief.  It appears that the month that she was on Qulipta  she did not have as many headaches.   Location: left eye last week but varies Frequency: last Aura: no Photophonia:yes  Phonophobia:yes  Nausea: yes  Vomiting: yes  Numbness: no Weakness: no Visual changes:no Gait and balance: no  Cardiac history: no    05/07/23: Sameena Greis is a 38 y.o. female with a history of migraine headaches. Returns today for follow-up.  She reports that she has approximately 2 headaches a week.  She  currently is taking topiramate  daily.  She uses rizatriptan  for abortive therapy.  She reports that it works relatively well but and sometimes not always is quick.  She never tried Ajovy .  She was given Ubrelvy  samples but not sure how effective they were.  She would like to try another preventative medication.  She returns today for an evaluation.   Follow-up Sep 11, 2021: Currently patient is on topiramate , propranolol , Ubrelvy , Zofran , Tylenol , sumatriptan  and amlodipine  which are all medications that can be used in migraine management.  At last appointment we spoke if she was not well controlled we could switch her to Qulipta  or Ajovy . She is having 15 migraine days a month and 20 total headaches. Tried [employer] Next would try Ajovy . Aimovig contraindicated due to constipation. 15 migraines a month, 20 total headache days a month, failed topiramate , propranolol , nortriptyline/amitroptyline.   Patient complains of symptoms per HPI as well as the following symptoms: improved migraines . Pertinent negatives and positives per HPI. All others negative   Follow up 06/14/2021: The topiramate  helped but last 2 weeks the migraines have returned. She has tried Rizatroptan at onset and doesn't really help, same for imitrex . No side effects. Increase Topiramate  to 50mg  then 100mg . Try Ubelvy. She has 6 migraine days a month, total 6 headache days a month.   HPI:  Zyasia Halbleib is a 38 y.o. female here as requested by [facility] for migraines. Started about a year ago. She has family history in  her sister with migraines. She was getting her migraines every day but they have significantly improved. Pulsating/pounding/throbbing, all over the head, can be unilateral, endorses nausea, also endorses light/sound sensitivity, they can last all day and be moderate to severe in pain. She has 10 migraine days a month.  No known etiology or inciting event.  Sumatriptan  did not help a lot. She is on a lot of  medications for pain right now, meloxicam, tramadol and Is also on propranolol  and norvasc  per patient, she said possibly the propranolol  has helped a little bit.  We discussed all her options.  We will hold off in an MRI since her symptoms appear to be improving and there is no red flags such as no vision changes, not positional or exertional, and her neurologic exam is normal.  We discussed options, prior older oral medications, the newer CGRP oral and injectable medications.  She really does not know what triggers the headaches, or makes them worse.No other focal neurologic deficits, associated symptoms, inciting events or modifiable factors.   Reviewed notes, labs and imaging from outside physicians, which showed:   From a review of records and patient report medications tried that can be used in migraine and headache management include: Failed emgality (had samples) for 5 months per patient, also propranolol  which she has been on for at least 3 months does not seem to be helping a lot, she is also on amlodipine  which she has been on for at least 3 months, Imitrex , starting topiramate , Maxalt .      REVIEW OF SYSTEMS: Out of a complete 14 system review of symptoms, the patient complains only of the following symptoms, and all other reviewed systems are negative.  ALLERGIES: No Known Allergies  HOME MEDICATIONS: Outpatient Medications Prior to Visit  Medication Sig Dispense Refill   amLODipine  (NORVASC ) 10 MG tablet Take 1 tablet (10 mg total) by mouth daily. 90 tablet 0   Atogepant  (QULIPTA ) 60 MG TABS Take 1 tablet (60 mg total) by mouth daily. 30 tablet 5   famotidine  (PEPCID ) 20 MG tablet Take 1 tablet (20 mg total) by mouth 2 (two) times daily. 60 tablet 0   Fremanezumab -vfrm (AJOVY ) 225 MG/1.5ML SOAJ Inject 225 mg into the skin every 30 (thirty) days. 1.5 mL 3   ondansetron  (ZOFRAN -ODT) 4 MG disintegrating tablet Take 1 tablet (4 mg total) by mouth every 8 (eight) hours as needed for  nausea or vomiting. 20 tablet 0   propranolol  ER (INDERAL  LA) 120 MG 24 hr capsule Take 1 capsule (120 mg total) by mouth daily. 90 capsule 6   rizatriptan  (MAXALT -MLT) 10 MG disintegrating tablet DISSOLVE 1 TABLET BY MOUTH AS NEEDED FORMIGRAINE MAY REPEAT IN 2 HOURS IF NEEDED 9 tablet 2   topiramate  (TOPAMAX ) 100 MG tablet TAKE ONE TABLET BY MOUTH AT BEDTIME AS NEEDED 90 tablet 0   Ubrogepant  (UBRELVY ) 100 MG TABS Take 100 mg by mouth every 2 (two) hours as needed. Maximum 200mg  a day. 16 tablet 11   No facility-administered medications prior to visit.    PAST MEDICAL HISTORY: Past Medical History:  Diagnosis Date   Anemia 2008   h/o   Hypertension    Migraine    UTI (urinary tract infection)     PAST SURGICAL HISTORY: Past Surgical History:  Procedure Laterality Date   CHOLECYSTECTOMY     IUD REMOVAL  2018   SHOULDER ARTHROSCOPY WITH LABRAL REPAIR Right 06/23/2021   Procedure: Right posterior labral repair with possible biceps tenodesis;  Surgeon: Tobie Priest, MD;  Location: [facility];  Service: Orthopedics;  Laterality: Right;   SHOULDER ARTHROSCOPY WITH SUBACROMIAL DECOMPRESSION Right 04/26/2017   Procedure: SHOULDER ARTHROSCOPY WITH SUBACROMIAL DECOMPRESSION;  Surgeon: Marchia Drivers, MD;  Location: [facility] OR[facility];  Service: Orthopedics;  Laterality: Right;    FAMILY HISTORY: Family History  Problem Relation Age of Onset   Hypertension Father    Diabetes Father    Migraines Sister    Hypertension Sister     SOCIAL HISTORY: Social History   Socioeconomic History   Marital status: Married    Spouse name: Antanio   Number of children: 2   Years of education: Not on file   Highest education level: Not on file  Occupational History   Not on file  Tobacco Use   Smoking status: Never   Smokeless tobacco: Never  Vaping Use   Vaping status: Never Used  Substance and Sexual Activity   Alcohol use: Yes    Comment: occ   Drug use: Not Currently    Types: Marijuana    Sexual activity: Not Currently    Partners: Male    Birth control/protection: Injection  Other Topics Concern   Not on file  Social History Narrative   ** Merged History Encounter **       Social Drivers of Corporate investment banker Strain: Not on file  Food Insecurity: Not on file  Transportation Needs: Not on file  Physical Activity: Not on file  Stress: Not on file  Social Connections: Not on file  Intimate Partner Violence: Not At Risk (01/25/2021)   Humiliation, Afraid, Rape, and Kick questionnaire    Fear of Current or Ex-Partner: No    Emotionally Abused: No    Physically Abused: No    Sexually Abused: No      PHYSICAL EXAM Generalized: Well developed, in no acute distress   Neurological examination  Mentation: Alert oriented to time, place, history taking. Follows all commands speech and language fluent Cranial nerve II-XII facial symmetry noted  DIAGNOSTIC DATA (LABS, IMAGING, TESTING) - I reviewed patient records, labs, notes, testing and imaging myself where available.  Lab Results  Component Value Date   WBC 5.7 06/22/2021   HGB 11.2 (L) 06/22/2021   HCT 34.3 (L) 06/22/2021   MCV 81.7 06/22/2021   PLT 282 06/22/2021      Component Value Date/Time   NA 137 06/22/2021 0823   NA 138 10/18/2020 1400   K 3.6 06/22/2021 0823   CL 110 06/22/2021 0823   CO2 21 (L) 06/22/2021 0823   GLUCOSE 107 (H) 06/22/2021 0823   BUN 11 06/22/2021 0823   BUN 6 10/18/2020 1400   CREATININE 0.88 06/22/2021 0823   CALCIUM 8.6 (L) 06/22/2021 0823   PROT 8.0 03/14/2021 0818   PROT 7.0 10/18/2020 1400   ALBUMIN 3.8 03/14/2021 0818   ALBUMIN 4.1 10/18/2020 1400   AST 16 03/14/2021 0818   ALT 11 03/14/2021 0818   ALKPHOS 46 03/14/2021 0818   BILITOT 0.4 03/14/2021 0818   BILITOT 0.4 10/18/2020 1400   GFRNONAA >60 06/22/2021 0823   GFRAA 125 08/11/2019 1039   Lab Results  Component Value Date   CHOL 145 10/18/2020   HDL 33 (L) 10/18/2020   LDLCALC 94 10/18/2020    TRIG 98 10/18/2020   Lab Results  Component Value Date   HGBA1C 5.5 10/10/2020   Lab Results  Component Value Date   VITAMINB12 998 10/18/2020   Lab Results  Component Value  Date   TSH 1.330 10/18/2020      ASSESSMENT AND PLAN 38 y.o. year old female  has a past medical history of Anemia (2008), Hypertension, Migraine, and UTI (urinary tract infection). here with:  1.  Migraine headaches   - Restart Qulipta  60 mg daily.  Information provided on her AVS -Continue rizatriptan  for abortive therapy.  Take 10 mg at the onset of a migraine can repeat in 2 hours if needed. -Continue Zofran  as needed for nausea -Follow-up in 8-10 months or sooner if needed     Duwaine Russell, MSN, NP-C 01/21/2024, 11:30 AM [facility] [address] [place], [place] [address] [phone]

## 2024-01-27 ENCOUNTER — Other Ambulatory Visit (HOSPITAL_COMMUNITY): Payer: Self-pay

## 2024-01-27 ENCOUNTER — Encounter: Payer: Self-pay | Admitting: Family Medicine

## 2024-01-27 ENCOUNTER — Telehealth: Payer: Self-pay

## 2024-01-27 ENCOUNTER — Ambulatory Visit (INDEPENDENT_AMBULATORY_CARE_PROVIDER_SITE_OTHER): Admitting: Family Medicine

## 2024-01-27 ENCOUNTER — Telehealth: Payer: Self-pay | Admitting: Pharmacy Technician

## 2024-01-27 VITALS — BP 124/84 | HR 73 | Ht 60.0 in | Wt 243.0 lb

## 2024-01-27 DIAGNOSIS — I10 Essential (primary) hypertension: Secondary | ICD-10-CM

## 2024-01-27 DIAGNOSIS — Z130 Encounter for screening for diseases of the blood and blood-forming organs and certain disorders involving the immune mechanism: Secondary | ICD-10-CM

## 2024-01-27 DIAGNOSIS — Z6841 Body Mass Index (BMI) 40.0 and over, adult: Secondary | ICD-10-CM

## 2024-01-27 DIAGNOSIS — G43009 Migraine without aura, not intractable, without status migrainosus: Secondary | ICD-10-CM

## 2024-01-27 DIAGNOSIS — R7303 Prediabetes: Secondary | ICD-10-CM | POA: Insufficient documentation

## 2024-01-27 DIAGNOSIS — G4733 Obstructive sleep apnea (adult) (pediatric): Secondary | ICD-10-CM

## 2024-01-27 DIAGNOSIS — Z3042 Encounter for surveillance of injectable contraceptive: Secondary | ICD-10-CM

## 2024-01-27 MED ORDER — SEMAGLUTIDE-WEIGHT MANAGEMENT 0.25 MG/0.5ML ~~LOC~~ SOAJ: 0.2500 mg | SUBCUTANEOUS | 0 refills | Status: DC

## 2024-01-27 NOTE — Telephone Encounter (Signed)
 Please let pt know, We can try Adipex instead.  Ty

## 2024-01-27 NOTE — Telephone Encounter (Signed)
 PA request has been Received. New Encounter has been or will be created for follow up. For additional info see Pharmacy Prior Auth telephone encounter from 01/27/24.

## 2024-01-27 NOTE — Telephone Encounter (Signed)
 Please review

## 2024-01-27 NOTE — Telephone Encounter (Signed)
 Pharmacy Patient Advocate Encounter  Effective October 1st, [place] will discontinue coverage of GLP1 medications for weight loss (such as Wegovy and Zepbound), unless the patient has a documented history of a heart attack or stroke. Zepbound will continue to be covered only for patients with moderate to severe sleep apnea (AHI 15-30). Because of this change, the prior authorization team will not be submitting new PA requests for GLP1 medications prescribed for weight loss between now and October 1st, as patients will be unable to continue therapy under Medicaid coverage.  Also Wegovy 0.25 mg/0.110ml injection is non-formulary drug on her primary prescription plan

## 2024-01-27 NOTE — Progress Notes (Signed)
 New Patient Office Visit  Subjective    Patient ID: Ellen Richardson, female    DOB: [birth year]  Age: 38 y.o. MRN: [id number]  CC:  Chief Complaint  Patient presents with   Establish Care    Patient is here to establish care    Weight Loss    Patient would like to discuss weight loss medication options     Assessment & Plan:   Morbid obesity with BMI of 45.0-49.9, adult (HCC) -     Hemoglobin A1c -     TSH -     Comprehensive metabolic panel with GFR -     Lipid panel -     Semaglutide -Weight Management; Inject 0.25 mg into the skin once a week.  Dispense: 2 mL; Refill: 0  OSA (obstructive sleep apnea) -     Semaglutide -Weight Management; Inject 0.25 mg into the skin once a week.  Dispense: 2 mL; Refill: 0  Migraine without aura and without status migrainosus, not intractable  Screening for iron deficiency anemia -     CBC with Differential/Platelet  Essential hypertension  Prediabetes  Depo-Provera  contraceptive status  Assessment and Plan Assessment & Plan Migraine Chronic migraines managed with Reprexain and Qulipta . Pharmacy issues delayed Qulipta . Uses Zofran  for nausea. - Continue Reprexain as needed. - Start Qulipta  once available. - Continue Zofran  for nausea.  Obstructive sleep apnea Suspected sleep apnea based on inconclusive study. Cost concerns hinder further testing. Possible contributor to elevated blood pressure, weight gain, and migraines. Discussed CPAP therapy if confirmed. - Discuss insurance coverage for repeat sleep study. - Consider referral to sleep specialist. - Discuss CPAP therapy if confirmed.  Elevated blood pressure Blood pressure elevated at 138/84 and 124/84. Not on antihypertensives. Previous amlodipine  discontinued. Discussed monitoring and potential medication. - Monitor blood pressure regularly. - Consider restarting antihypertensive medication if elevated.  Morbid obesity Struggles with weight loss despite efforts. Insurance  coverage uncertain. Discussed Wegovy side effects. - Prescribe Wegovy and send to [facility]. - Check insurance coverage for [facility]. - Perform blood work for prediabetes.  Prediabetes Prediabetes status noted. Blood work needed to assess and Chemical engineer. - Order blood work for prediabetes status.  General Health Maintenance Discussed long-term Depo-Provera  use and risks. Plans for hysterectomy to eliminate need for Depo-Provera . - Continue Depo-Provera  until hysterectomy. - Reschedule hysterectomy as planned.     Return in about 4 weeks (around 02/24/2024).   Ellen K Ellen Polinsky, MD   HPI    Ellen Richardson presents to establish care Discussed the use of AI scribe software for clinical note transcription with the patient, who gave verbal consent to proceed.  History of Present Illness Ellen Richardson is a 38 year old female with migraines and hypertension who presents for medication management and follow-up.  She manages her migraines with Reprexain as needed for acute episodes and uses Zofran  for associated nausea. She has been prescribed Qulipta  but has not obtained it due to pharmacy availability issues. Previously, she was on Topamax  but discontinued it as per her neurologist's advice.  She has a history of elevated blood pressure readings but is not currently on antihypertensive medication. She previously took amlodipine  but stopped because she believes her blood pressure is now controlled. Recent clinic readings were 138/84 and 124/84. She does not monitor her blood pressure at home.  She has been on Depo-Provera  for several years for birth control and received her last injection last week. Plans for a hysterectomy have been postponed due to her husband's recent accident.  She is interested in weight loss medications and has tried diet and exercise without significant success. She has not previously used weight loss medications but is considering options covered by her insurance.  She has a history of prediabetes and underwent a sleep study three years ago, which was inconclusive due to equipment issues. She has not repeated the study due to cost concerns.  Family history includes diabetes on both sides and a grandmother who passed away from leukemia. No personal or family history of thyroid cancer or pancreatitis. No current symptoms of pancreatitis or thyroid cancer.    Outpatient Encounter Medications as of 01/27/2024  Medication Sig   Atogepant  (QULIPTA ) 60 MG TABS Take 1 tablet (60 mg total) by mouth daily.   ibuprofen  (ADVIL ) 600 MG tablet Take 600 mg by mouth every 8 (eight) hours as needed.   medroxyPROGESTERone  Acetate 150 MG/ML SUSY Inject into the muscle.   ondansetron  (ZOFRAN -ODT) 4 MG disintegrating tablet Take 1 tablet (4 mg total) by mouth every 8 (eight) hours as needed for nausea or vomiting.   rizatriptan  (MAXALT -MLT) 10 MG disintegrating tablet DISSOLVE 1 TABLET BY MOUTH AS NEEDED FORMIGRAINE MAY REPEAT IN 2 HOURS IF NEEDED   semaglutide -weight management (WEGOVY) 0.25 MG/0.5ML SOAJ SQ injection Inject 0.25 mg into the skin once a week.   amLODipine  (NORVASC ) 10 MG tablet Take 1 tablet (10 mg total) by mouth daily. (Patient not taking: Reported on 01/27/2024)   [DISCONTINUED] famotidine  (PEPCID ) 20 MG tablet Take 1 tablet (20 mg total) by mouth 2 (two) times daily.   [DISCONTINUED] topiramate  (TOPAMAX ) 100 MG tablet TAKE ONE TABLET BY MOUTH AT BEDTIME AS NEEDED (Patient not taking: Reported on 01/27/2024)   No facility-administered encounter medications on file as of 01/27/2024.      Review of Systems  All other systems reviewed and are negative.       Objective    BP 124/84   Pulse 73   Ht 5' (1.524 m)   Wt 243 lb (110.2 kg)   LMP 01/20/2024   SpO2 99%   BMI 47.46 kg/m   Physical Exam Vitals and nursing note reviewed.  Constitutional:      Appearance: Normal appearance.  HENT:     Head: Normocephalic.     Right Ear: External ear  normal.     Left Ear: External ear normal.  Eyes:     Conjunctiva/sclera: Conjunctivae normal.  Cardiovascular:     Rate and Rhythm: Normal rate.  Pulmonary:     Effort: Pulmonary effort is normal. No respiratory distress.  Abdominal:     Palpations: Abdomen is soft.  Musculoskeletal:        General: Normal range of motion.  Skin:    General: Skin is warm.  Neurological:     Mental Status: She is alert and oriented to person, place, and time.  Psychiatric:        Mood and Affect: Mood normal.

## 2024-01-28 ENCOUNTER — Ambulatory Visit: Payer: Self-pay | Admitting: Family Medicine

## 2024-01-28 ENCOUNTER — Other Ambulatory Visit (HOSPITAL_COMMUNITY): Payer: Self-pay

## 2024-01-28 LAB — CBC WITH DIFFERENTIAL/PLATELET
Basophils Absolute: 0 x10E3/uL (ref 0.0–0.2)
Basos: 1 %
EOS (ABSOLUTE): 0 x10E3/uL (ref 0.0–0.4)
Eos: 1 %
Hematocrit: 41.1 % (ref 34.0–46.6)
Hemoglobin: 12.8 g/dL (ref 11.1–15.9)
Immature Grans (Abs): 0 x10E3/uL (ref 0.0–0.1)
Immature Granulocytes: 0 %
Lymphocytes Absolute: 1.9 x10E3/uL (ref 0.7–3.1)
Lymphs: 43 %
MCH: 26.1 pg — ABNORMAL LOW (ref 26.6–33.0)
MCHC: 31.1 g/dL — ABNORMAL LOW (ref 31.5–35.7)
MCV: 84 fL (ref 79–97)
Monocytes Absolute: 0.2 x10E3/uL (ref 0.1–0.9)
Monocytes: 4 %
Neutrophils Absolute: 2.2 x10E3/uL (ref 1.4–7.0)
Neutrophils: 51 %
Platelets: 283 x10E3/uL (ref 150–450)
RBC: 4.91 x10E6/uL (ref 3.77–5.28)
RDW: 15 % (ref 11.7–15.4)
WBC: 4.4 x10E3/uL (ref 3.4–10.8)

## 2024-01-28 LAB — COMPREHENSIVE METABOLIC PANEL WITH GFR
ALT: 9 IU/L (ref 0–32)
AST: 13 IU/L (ref 0–40)
Albumin: 4.3 g/dL (ref 3.9–4.9)
Alkaline Phosphatase: 83 IU/L (ref 41–116)
BUN/Creatinine Ratio: 10 (ref 9–23)
BUN: 8 mg/dL (ref 6–20)
Bilirubin Total: 0.6 mg/dL (ref 0.0–1.2)
CO2: 19 mmol/L — ABNORMAL LOW (ref 20–29)
Calcium: 9.2 mg/dL (ref 8.7–10.2)
Chloride: 106 mmol/L (ref 96–106)
Creatinine, Ser: 0.81 mg/dL (ref 0.57–1.00)
Globulin, Total: 3.2 g/dL (ref 1.5–4.5)
Glucose: 85 mg/dL (ref 70–99)
Potassium: 4.2 mmol/L (ref 3.5–5.2)
Sodium: 140 mmol/L (ref 134–144)
Total Protein: 7.5 g/dL (ref 6.0–8.5)
eGFR: 96 mL/min/1.73 (ref >59)

## 2024-01-28 LAB — LIPID PANEL
Chol/HDL Ratio: 4.2 ratio (ref 0.0–4.4)
Cholesterol, Total: 156 mg/dL (ref 100–199)
HDL: 37 mg/dL — ABNORMAL LOW (ref >39)
LDL Chol Calc (NIH): 99 mg/dL (ref 0–99)
Triglycerides: 107 mg/dL (ref 0–149)
VLDL Cholesterol Cal: 20 mg/dL (ref 5–40)

## 2024-01-28 LAB — TSH: TSH: 1.79 u[IU]/mL (ref 0.450–4.500)

## 2024-01-28 LAB — HEMOGLOBIN A1C
Est. average glucose Bld gHb Est-mCnc: 126 mg/dL
Hgb A1c MFr Bld: 6 % — ABNORMAL HIGH (ref 4.8–5.6)

## 2024-01-28 NOTE — Telephone Encounter (Signed)
 Please review patient's response.

## 2024-01-29 ENCOUNTER — Other Ambulatory Visit: Payer: Self-pay | Admitting: Family Medicine

## 2024-01-29 DIAGNOSIS — G4733 Obstructive sleep apnea (adult) (pediatric): Secondary | ICD-10-CM

## 2024-01-29 MED ORDER — PHENTERMINE HCL 37.5 MG PO TABS: 37.5000 mg | ORAL_TABLET | Freq: Every day | ORAL | 0 refills | Status: DC

## 2024-01-29 MED ORDER — METFORMIN HCL 500 MG PO TABS: 500.0000 mg | ORAL_TABLET | Freq: Two times a day (BID) | ORAL | 3 refills | Status: AC

## 2024-01-29 NOTE — Telephone Encounter (Signed)
 Please review patient's response.

## 2024-01-30 ENCOUNTER — Other Ambulatory Visit: Payer: Self-pay | Admitting: Family Medicine

## 2024-01-30 ENCOUNTER — Telehealth: Payer: Self-pay

## 2024-01-30 MED ORDER — NALTREXONE-BUPROPION HCL ER 8-90 MG PO TB12: ORAL_TABLET | ORAL | 0 refills | Status: DC

## 2024-01-30 NOTE — Telephone Encounter (Signed)
 I called in Contrave for weight loss.

## 2024-01-30 NOTE — Telephone Encounter (Signed)
 Copied from CRM [id number]. Topic: Clinical - Prescription Issue >> Jan 30, 2024 12:36 PM Ellen Richardson E wrote: Reason for CRM: Pt called to report that she has taken phentermine (ADIPEX-P) 37.5 MG tablet in the past and it has not worked for her. Please advise

## 2024-02-07 ENCOUNTER — Telehealth: Payer: Self-pay | Admitting: Pharmacy Technician

## 2024-02-07 ENCOUNTER — Other Ambulatory Visit (HOSPITAL_COMMUNITY): Payer: Self-pay

## 2024-02-07 NOTE — Telephone Encounter (Signed)
 Please review

## 2024-02-07 NOTE — Telephone Encounter (Signed)
 Hey Ellen Richardson,   Could we do a PA for Ozempic?

## 2024-02-07 NOTE — Telephone Encounter (Signed)
 Ellen Richardson is approved exclusively as an adjunct to diet and exercise to improve glycemic control in adults with type 2 diabetes mellitus. A review of patient's medical chart reveals no documented diagnosis of type 2 diabetes or an A1C indicative of diabetes. Therefore, they do not currently meet the criteria for prior authorization of this medication. If clinically appropriate, alternative  options such as Saxenda, Zepbound, or Wegovy  may be considered for this patient.

## 2024-02-07 NOTE — Telephone Encounter (Signed)
 Please review patient's message:

## 2024-02-07 NOTE — Telephone Encounter (Signed)
 Good afternoon dear, unfortunately she do not qualify for Ozempic because she has to have/or had an A1C of 6.5 or higher to be considered type 2 diabetic and the highest A1C  that I can find in her chart was 6.0

## 2024-02-07 NOTE — Telephone Encounter (Signed)
 Hi Ellen Richardson , She has 2 different insurance plans, could please check. Ty

## 2024-02-10 ENCOUNTER — Other Ambulatory Visit (HOSPITAL_COMMUNITY): Payer: Self-pay

## 2024-02-10 ENCOUNTER — Telehealth: Payer: Self-pay

## 2024-02-10 NOTE — Telephone Encounter (Signed)
 Good morning, [employer] will not cover wegovy and the only other plan I have for her is a medicaid plan which will not cover anymore

## 2024-02-10 NOTE — Telephone Encounter (Signed)
 Please review

## 2024-02-10 NOTE — Telephone Encounter (Signed)
 Pharmacy Patient Advocate Encounter   Received notification from CoverMyMeds that prior authorization for Qulipta  is required/requested.   Insurance verification completed.   The patient is insured through [employer].   Per test claim: PA required; PA submitted to above mentioned insurance via Latent Key/confirmation #/EOC [id number] Status is pending

## 2024-02-11 NOTE — Telephone Encounter (Signed)
 Pharmacy Patient Advocate Encounter  Received notification from [employer] that Prior Authorization for Qulipta  has been APPROVED from 02/10/2024 to 02/09/2026   PA #/Case ID/Reference #: [id number]

## 2024-02-28 ENCOUNTER — Ambulatory Visit: Admitting: Family Medicine

## 2024-03-02 ENCOUNTER — Ambulatory Visit: Admitting: Family Medicine

## 2024-03-09 ENCOUNTER — Ambulatory Visit: Admitting: Family Medicine

## 2024-03-09 ENCOUNTER — Encounter: Payer: Self-pay | Admitting: Family Medicine

## 2024-03-09 VITALS — BP 124/82 | HR 83 | Ht 60.0 in | Wt 246.0 lb

## 2024-03-09 DIAGNOSIS — R7303 Prediabetes: Secondary | ICD-10-CM

## 2024-03-09 DIAGNOSIS — Z23 Encounter for immunization: Secondary | ICD-10-CM

## 2024-03-09 DIAGNOSIS — G4733 Obstructive sleep apnea (adult) (pediatric): Secondary | ICD-10-CM | POA: Diagnosis not present

## 2024-03-09 DIAGNOSIS — I10 Essential (primary) hypertension: Secondary | ICD-10-CM | POA: Diagnosis not present

## 2024-03-09 DIAGNOSIS — Z6841 Body Mass Index (BMI) 40.0 and over, adult: Secondary | ICD-10-CM

## 2024-03-09 NOTE — Progress Notes (Signed)
 Established Patient Office Visit  Patient ID: Ellen Richardson, female    DOB: [birth year]  Age: 38 y.o. MRN: [id number] PCP: Aqil Goetting K, MD  Chief Complaint  Patient presents with   Weight Check    Subjective:     HPI  Discussed the use of AI scribe software for clinical note transcription with the patient, who gave verbal consent to proceed.  History of Present Illness Ellen Richardson is a 38 year old female who presents for follow-up on weight management and sleep study concerns.  She has been attempting to manage her weight through diet and exercise but finds gym memberships to be ineffective. She has tried different gyms, including a personal fitness gym and [employer], but did not find them beneficial. She has been doing her own meal prep. Her insurance denied coverage for her weight loss medication.  She previously underwent a sleep study but is not concerned about the results as she believes the study was not conducted properly. She recalls waking up without the oxygen monitor on her finger or nose. She is considering retaking the sleep study.  She is currently taking metformin 500 mg with meals. She was prescribed blood pressure medication, Lopin, but has not been taking it as her blood pressure readings have been normal for over a month.      Review of Systems  All other systems reviewed and are negative.     Objective:     BP 124/82   Pulse 83   Ht 5' (1.524 m)   Wt 246 lb (111.6 kg)   LMP  (LMP Unknown)   SpO2 99%   BMI 48.04 kg/m  BP Readings from Last 3 Encounters:  03/09/24 124/82  01/27/24 124/84  06/23/21 102/73      Physical Exam Vitals and nursing note reviewed.  Constitutional:      Appearance: Normal appearance.  HENT:     Head: Normocephalic.     Right Ear: External ear normal.     Left Ear: External ear normal.  Eyes:     Conjunctiva/sclera: Conjunctivae normal.  Cardiovascular:     Rate and Rhythm: Normal rate.  Pulmonary:      Effort: Pulmonary effort is normal. No respiratory distress.  Abdominal:     Palpations: Abdomen is soft.  Musculoskeletal:        General: Normal range of motion.  Skin:    General: Skin is warm.  Neurological:     Mental Status: She is alert and oriented to person, place, and time.  Psychiatric:        Mood and Affect: Mood normal.     Physical Exam     No results found for any visits on 03/09/24.  Last metabolic panel Lab Results  Component Value Date   GLUCOSE 85 01/27/2024   NA 140 01/27/2024   K 4.2 01/27/2024   CL 106 01/27/2024   CO2 19 (L) 01/27/2024   BUN 8 01/27/2024   CREATININE 0.81 01/27/2024   EGFR 96 01/27/2024   CALCIUM 9.2 01/27/2024   PROT 7.5 01/27/2024   ALBUMIN 4.3 01/27/2024   LABGLOB 3.2 01/27/2024   AGRATIO 1.4 10/18/2020   BILITOT 0.6 01/27/2024   ALKPHOS 83 01/27/2024   AST 13 01/27/2024   ALT 9 01/27/2024   ANIONGAP 6 06/22/2021   Last lipids Lab Results  Component Value Date   CHOL 156 01/27/2024   HDL 37 (L) 01/27/2024   LDLCALC 99 01/27/2024   TRIG 107 01/27/2024  CHOLHDL 4.2 01/27/2024   Last hemoglobin A1c Lab Results  Component Value Date   HGBA1C 6.0 (H) 01/27/2024   Last thyroid functions Lab Results  Component Value Date   TSH 1.790 01/27/2024   FREET4 1.06 10/18/2020      The ASCVD Risk score (Arnett DK, et al., 2019) failed to calculate for the following reasons:   The 2019 ASCVD risk score is only valid for ages 57 to 57    Assessment & Plan:   Problem List Items Addressed This Visit       Respiratory   OSA (obstructive sleep apnea)   Relevant Orders   Ambulatory referral to Sleep Studies     Other   Morbid obesity with BMI of 45.0-49.9, adult (HCC) - Primary   Relevant Orders   Amb Ref to Medical Weight Management    Assessment and Plan Assessment & Plan Morbid obesity BMI 45.0-49.9. Insurance denied weight loss medications. Gym workouts unhelpful. Referral to weight loss clinic  planned. - Placed referral to a weight loss clinic for further management.  Obstructive sleep apnea Previous sleep study inconclusive?   Agreed to retry sleep study. - Placed referral for a repeat sleep study.  Prediabetes Managed with metformin.  - Continue metformin 500 mg before meals.  Essential hypertension Well within range. Not on antihypertensive medication. Blood pressure normal. Off medication for over a month.  General Health Maintenance Cervical cancer screening due. Regular OB GYN with upcoming appointment. Tetanus shot overdue. - Ensure cervical cancer screening results are sent to the office for records.    Return in about 6 months (around 09/06/2024) for annual.    Ladoris MARLA Ny, MD [facility] at [facility]

## 2024-03-18 ENCOUNTER — Ambulatory Visit: Admitting: Sleep Medicine

## 2024-03-18 ENCOUNTER — Encounter: Payer: Self-pay | Admitting: Sleep Medicine

## 2024-03-18 VITALS — BP 120/82 | HR 89 | Temp 97.8°F | Ht 60.0 in | Wt 247.8 lb

## 2024-03-18 DIAGNOSIS — G4733 Obstructive sleep apnea (adult) (pediatric): Secondary | ICD-10-CM

## 2024-03-18 NOTE — Patient Instructions (Addendum)
 SABRA

## 2024-03-18 NOTE — Progress Notes (Signed)
 Name:Ellen Richardson MRN: [id number] DOB: 1985-09-26   CHIEF COMPLAINT:  EXCESSIVE DAYTIME SLEEPINESS   HISTORY OF PRESENT ILLNESS: Ellen Richardson is a 38 y.o. w/ a h/o prediabetes, migraine headaches, HTN and morbid obesity who presents for c/o loud snoring and excessive daytime sleepiness which has been present for several years. Reports nocturnal awakenings due to nocturia, however does not have difficulty falling back to sleep. Reports a 50 lb weight gain over the last few years. Admits to morning headaches. Denies RLS symptoms, dream enactment, cataplexy, hypnagogic or hypnapompic hallucinations. Denies a family history of sleep apnea. Denies drowsy driving. Drinks 1 cup of coffee and several bottles of tea daily, denies alcohol, tobacco or illicit drug use.   Bedtime 10:30-11 pm Sleep onset 2 mins Rise time 8:30-9 am   EPWORTH SLEEP SCORE 12    03/18/2024    9:35 AM  Results of the Epworth flowsheet  Sitting and reading 2  Watching TV 3  Sitting, inactive in a public place (e.g. a theatre or a meeting) 2  As a passenger in a car for an hour without a break 3  Lying down to rest in the afternoon when circumstances permit 2  Sitting and talking to someone 0  Sitting quietly after a lunch without alcohol 0  In a car, while stopped for a few minutes in traffic 0  Total score 12    PAST MEDICAL HISTORY :   has a past medical history of Anemia (2008), Hypertension, Migraine, and UTI (urinary tract infection).  has a past surgical history that includes IUD removal (2018); Shoulder arthroscopy with subacromial decompression (Right, 04/26/2017); Cholecystectomy; and Shoulder arthroscopy with labral repair (Right, 06/23/2021). Prior to Admission medications   Medication Sig Start Date End Date Taking? Authorizing Provider  Atogepant  (QULIPTA ) 60 MG TABS Take 1 tablet (60 mg total) by mouth daily. 01/21/24  Yes Millikan, Megan, NP  ibuprofen  (ADVIL ) 600 MG tablet Take 600 mg by  mouth every 8 (eight) hours as needed. 11/07/23  Yes [provider]  medroxyPROGESTERone  Acetate 150 MG/ML SUSY Inject into the muscle. 01/07/24  Yes [provider]  metFORMIN  (GLUCOPHAGE ) 500 MG tablet Take 1 tablet (500 mg total) by mouth 2 (two) times daily with a meal. 01/29/24  Yes Kotturi, Mackey POUR, MD  ondansetron  (ZOFRAN -ODT) 4 MG disintegrating tablet Take 1 tablet (4 mg total) by mouth every 8 (eight) hours as needed for nausea or vomiting. 06/23/21  Yes Tobie Priest, MD  rizatriptan  (MAXALT -MLT) 10 MG disintegrating tablet DISSOLVE 1 TABLET BY MOUTH AS NEEDED FORMIGRAINE MAY REPEAT IN 2 HOURS IF NEEDED 11/06/23  Yes Millikan, Megan, NP  amLODipine  (NORVASC ) 10 MG tablet Take 1 tablet (10 mg total) by mouth daily. Patient not taking: Reported on 03/18/2024 10/10/20   Liana Fish, NP   No Known Allergies  FAMILY HISTORY:  family history includes Diabetes in her father; Hypertension in her father and sister; Migraines in her sister. SOCIAL HISTORY:  reports that she has never smoked. She has never used smokeless tobacco. She reports current alcohol use. She reports that she does not currently use drugs after having used the following drugs: Marijuana.   Review of Systems:  Gen:  Denies  fever, sweats, chills weight loss  HEENT: Denies blurred vision, double vision, ear pain, eye pain, hearing loss, nose bleeds, sore throat Cardiac:  No dizziness, chest pain or heaviness, chest tightness,edema, No JVD Resp:   No cough, -sputum production, -shortness of breath,-wheezing, -hemoptysis,  Gi: Denies swallowing difficulty, stomach pain, nausea or vomiting, diarrhea, constipation, bowel incontinence Gu:  Denies bladder incontinence, burning urine Ext:   Denies Joint pain, stiffness or swelling Skin: Denies  skin rash, easy bruising or bleeding or hives Endoc:  Denies polyuria, polydipsia , polyphagia or weight change Psych:   Denies depression, insomnia or hallucinations   Other:  All other systems negative  VITAL SIGNS: BP 120/82   Pulse 89   Temp 97.8 F (36.6 C)   Ht 5' (1.524 m)   Wt 247 lb 12.8 oz (112.4 kg)   LMP  (LMP Unknown)   SpO2 99%   BMI 48.40 kg/m    Physical Examination:   General Appearance: No distress  EYES PERRLA, EOM intact.   NECK Supple, No JVD Pulmonary: normal breath sounds, No wheezing.  CardiovascularNormal S1,S2.  No m/r/g.   Abdomen: Benign, Soft, non-tender. Skin:   warm, no rashes, no ecchymosis  Extremities: normal, no cyanosis, clubbing. Neuro:without focal findings,  speech normal  PSYCHIATRIC: Mood, affect within normal limits.   ASSESSMENT AND PLAN  OSA I suspect that OSA is likely present due to clinical presentation. Discussed the consequences of untreated sleep apnea. Advised not to drive drowsy for safety of patient and others. Will complete further evaluation with a home sleep study and follow up to review results.    Morbid obesity Counseled patient on diet and lifestyle modification.    MEDICATION ADJUSTMENTS/LABS AND TESTS ORDERED: Recommend Sleep Study   Patient  satisfied with Plan of action and management. All questions answered  Follow up to review HST results and treatment plan.   I spent a total of 48 minutes reviewing chart data, face-to-face evaluation with the patient, counseling and coordination of care as detailed above.    Thelton Graca, M.D.  Sleep Medicine [facility]

## 2024-04-09 ENCOUNTER — Ambulatory Visit (INDEPENDENT_AMBULATORY_CARE_PROVIDER_SITE_OTHER): Admitting: Adult Health

## 2024-05-20 ENCOUNTER — Encounter

## 2024-05-20 DIAGNOSIS — G4733 Obstructive sleep apnea (adult) (pediatric): Secondary | ICD-10-CM

## 2024-05-21 ENCOUNTER — Encounter: Payer: Self-pay | Admitting: Family Medicine

## 2024-05-21 DIAGNOSIS — G4733 Obstructive sleep apnea (adult) (pediatric): Secondary | ICD-10-CM | POA: Diagnosis not present

## 2024-05-21 NOTE — Telephone Encounter (Signed)
 Please review and advise patient.   JM

## 2024-05-27 ENCOUNTER — Ambulatory Visit: Payer: Self-pay

## 2024-06-02 ENCOUNTER — Ambulatory Visit: Admitting: Sleep Medicine

## 2024-06-02 ENCOUNTER — Encounter: Payer: Self-pay | Admitting: Sleep Medicine

## 2024-06-02 VITALS — BP 126/82 | HR 91 | Temp 98.1°F | Ht 60.0 in | Wt 249.2 lb

## 2024-06-02 DIAGNOSIS — G4733 Obstructive sleep apnea (adult) (pediatric): Secondary | ICD-10-CM | POA: Diagnosis not present

## 2024-06-02 DIAGNOSIS — Z6841 Body Mass Index (BMI) 40.0 and over, adult: Secondary | ICD-10-CM

## 2024-06-02 MED ORDER — ZEPBOUND 2.5 MG/0.5ML ~~LOC~~ SOAJ: 2.5000 mg | SUBCUTANEOUS | 0 refills | Status: AC

## 2024-06-02 NOTE — Patient Instructions (Signed)
" °  Patient Instructions Need to be using your CPAP every night, minimum of 7-8 hours a night.  Change equipment as directed. Wash your tubing with warm soap and water  weekly, hang to dry. Wash humidifier portion weekly. Use bottled, distilled water  and change daily Be aware of reduced alertness and do not drive or operate heavy machinery if experiencing this or drowsiness.  Exercise encouraged, as tolerated. Healthy weight management discussed.  Avoid or decrease alcohol consumption and medications that make you more sleepy, if possible. Notify if persistent daytime sleepiness occurs even with consistent use of PAP therapy.   Change CPAP supplies... Every month Mask cushions and/or nasal pillows CPAP machine filters Every 3 months Mask frame (not including the headgear) CPAP tubing Every 6 months Mask headgear Chin strap (if applicable) Humidifier water  tub   Be aware of reduced alertness and do not drive or operate heavy machinery if experiencing this or drowsiness.  Exercise encouraged, as tolerated. Encouraged proper weight management.  Important to get eight or more hours of sleep  Limiting the use of the computer and television before bedtime.  Decrease naps during the day, so night time sleep will become enhanced.  Limit caffeine, and sleep deprivation.  HTN, stroke, uncontrolled diabetes and heart failure are potential risk factors.  Risk of untreated sleep apnea including cardiac arrhthymias, stroke, DM, pulm HTN.           "

## 2024-08-31 ENCOUNTER — Encounter: Admitting: Family Medicine

## 2024-09-23 ENCOUNTER — Ambulatory Visit: Admitting: Sleep Medicine

## 2024-11-19 ENCOUNTER — Telehealth: Admitting: Adult Health
